# Patient Record
Sex: Male | Born: 1955 | Race: White | Hispanic: No | Marital: Married | Smoking: Never smoker
Health system: Southern US, Community
[De-identification: ages and names within clinical notes are randomized; demographics above are authoritative.]

## PROBLEM LIST (undated history)

## (undated) DIAGNOSIS — G8929 Other chronic pain: Secondary | ICD-10-CM

## (undated) DIAGNOSIS — M25552 Pain in left hip: Secondary | ICD-10-CM

## (undated) DIAGNOSIS — H539 Unspecified visual disturbance: Secondary | ICD-10-CM

## (undated) DIAGNOSIS — M419 Scoliosis, unspecified: Secondary | ICD-10-CM

## (undated) DIAGNOSIS — M545 Low back pain, unspecified: Secondary | ICD-10-CM

## (undated) DIAGNOSIS — R519 Headache, unspecified: Secondary | ICD-10-CM

## (undated) DIAGNOSIS — M1990 Unspecified osteoarthritis, unspecified site: Secondary | ICD-10-CM

## (undated) DIAGNOSIS — M4800 Spinal stenosis, site unspecified: Secondary | ICD-10-CM

## (undated) DIAGNOSIS — M25551 Pain in right hip: Secondary | ICD-10-CM

## (undated) HISTORY — PX: TONSILLECTOMY: SUR1361

## (undated) HISTORY — PX: ROTATOR CUFF REPAIR: SHX139

## (undated) HISTORY — DX: Scoliosis, unspecified: M41.9

## (undated) HISTORY — DX: Low back pain, unspecified: M54.50

## (undated) HISTORY — DX: Pain in right hip: M25.551

## (undated) HISTORY — DX: Pain in left hip: M25.552

## (undated) HISTORY — DX: Other chronic pain: G89.29

## (undated) HISTORY — PX: INGUINAL HERNIA REPAIR: SUR1180

## (undated) HISTORY — PX: VASECTOMY: SHX75

## (undated) HISTORY — DX: Unspecified visual disturbance: H53.9

## (undated) HISTORY — PX: ANTERIOR CRUCIATE LIGAMENT REPAIR: SHX115

## (undated) HISTORY — DX: Spinal stenosis, site unspecified: M48.00

## (undated) HISTORY — PX: CARPAL TUNNEL RELEASE: SHX101

## (undated) HISTORY — DX: Headache, unspecified: R51.9

---

## 2007-09-17 HISTORY — PX: SPINAL CORD STIMULATOR IMPLANT: SHX2422

## 2017-01-09 DIAGNOSIS — M542 Cervicalgia: Secondary | ICD-10-CM | POA: Insufficient documentation

## 2017-01-09 DIAGNOSIS — Z9689 Presence of other specified functional implants: Secondary | ICD-10-CM | POA: Insufficient documentation

## 2018-01-30 DIAGNOSIS — G894 Chronic pain syndrome: Secondary | ICD-10-CM | POA: Insufficient documentation

## 2018-01-30 DIAGNOSIS — M5030 Other cervical disc degeneration, unspecified cervical region: Secondary | ICD-10-CM | POA: Insufficient documentation

## 2018-01-30 DIAGNOSIS — M542 Cervicalgia: Secondary | ICD-10-CM | POA: Insufficient documentation

## 2018-01-30 DIAGNOSIS — M47812 Spondylosis without myelopathy or radiculopathy, cervical region: Secondary | ICD-10-CM | POA: Insufficient documentation

## 2020-05-17 DEATH — deceased

## 2020-07-06 DIAGNOSIS — M62830 Muscle spasm of back: Secondary | ICD-10-CM | POA: Diagnosis not present

## 2020-07-06 DIAGNOSIS — M546 Pain in thoracic spine: Secondary | ICD-10-CM | POA: Diagnosis not present

## 2020-07-06 DIAGNOSIS — M545 Low back pain, unspecified: Secondary | ICD-10-CM | POA: Diagnosis not present

## 2020-07-07 DIAGNOSIS — Z03818 Encounter for observation for suspected exposure to other biological agents ruled out: Secondary | ICD-10-CM | POA: Diagnosis not present

## 2020-07-07 DIAGNOSIS — Z20822 Contact with and (suspected) exposure to covid-19: Secondary | ICD-10-CM | POA: Diagnosis not present

## 2020-08-08 DIAGNOSIS — H1820 Unspecified corneal edema: Secondary | ICD-10-CM | POA: Diagnosis not present

## 2020-08-22 DIAGNOSIS — H1820 Unspecified corneal edema: Secondary | ICD-10-CM | POA: Diagnosis not present

## 2020-08-25 DIAGNOSIS — M19019 Primary osteoarthritis, unspecified shoulder: Secondary | ICD-10-CM | POA: Diagnosis not present

## 2020-08-25 DIAGNOSIS — M25551 Pain in right hip: Secondary | ICD-10-CM | POA: Diagnosis not present

## 2020-08-25 DIAGNOSIS — M5020 Other cervical disc displacement, unspecified cervical region: Secondary | ICD-10-CM | POA: Diagnosis not present

## 2020-08-25 DIAGNOSIS — M25552 Pain in left hip: Secondary | ICD-10-CM | POA: Diagnosis not present

## 2020-12-04 DIAGNOSIS — S40011A Contusion of right shoulder, initial encounter: Secondary | ICD-10-CM | POA: Diagnosis not present

## 2020-12-04 DIAGNOSIS — M19011 Primary osteoarthritis, right shoulder: Secondary | ICD-10-CM | POA: Diagnosis not present

## 2021-01-09 DIAGNOSIS — M7062 Trochanteric bursitis, left hip: Secondary | ICD-10-CM | POA: Diagnosis not present

## 2021-01-09 DIAGNOSIS — M19011 Primary osteoarthritis, right shoulder: Secondary | ICD-10-CM | POA: Diagnosis not present

## 2021-01-09 DIAGNOSIS — M7061 Trochanteric bursitis, right hip: Secondary | ICD-10-CM | POA: Diagnosis not present

## 2021-01-09 DIAGNOSIS — M19012 Primary osteoarthritis, left shoulder: Secondary | ICD-10-CM | POA: Diagnosis not present

## 2021-01-18 DIAGNOSIS — M19011 Primary osteoarthritis, right shoulder: Secondary | ICD-10-CM | POA: Diagnosis not present

## 2021-01-18 DIAGNOSIS — M19012 Primary osteoarthritis, left shoulder: Secondary | ICD-10-CM | POA: Diagnosis not present

## 2021-01-18 DIAGNOSIS — M160 Bilateral primary osteoarthritis of hip: Secondary | ICD-10-CM | POA: Diagnosis not present

## 2021-01-23 DIAGNOSIS — M19011 Primary osteoarthritis, right shoulder: Secondary | ICD-10-CM | POA: Diagnosis not present

## 2021-01-23 DIAGNOSIS — M7062 Trochanteric bursitis, left hip: Secondary | ICD-10-CM | POA: Diagnosis not present

## 2021-01-23 DIAGNOSIS — M19012 Primary osteoarthritis, left shoulder: Secondary | ICD-10-CM | POA: Diagnosis not present

## 2021-01-23 DIAGNOSIS — M7061 Trochanteric bursitis, right hip: Secondary | ICD-10-CM | POA: Diagnosis not present

## 2021-01-30 DIAGNOSIS — M545 Low back pain, unspecified: Secondary | ICD-10-CM | POA: Diagnosis not present

## 2021-01-30 DIAGNOSIS — G894 Chronic pain syndrome: Secondary | ICD-10-CM | POA: Diagnosis not present

## 2021-01-30 DIAGNOSIS — M542 Cervicalgia: Secondary | ICD-10-CM | POA: Diagnosis not present

## 2021-01-30 DIAGNOSIS — M1611 Unilateral primary osteoarthritis, right hip: Secondary | ICD-10-CM | POA: Diagnosis not present

## 2021-02-01 DIAGNOSIS — Z20822 Contact with and (suspected) exposure to covid-19: Secondary | ICD-10-CM | POA: Diagnosis not present

## 2021-02-02 DIAGNOSIS — Z03818 Encounter for observation for suspected exposure to other biological agents ruled out: Secondary | ICD-10-CM | POA: Diagnosis not present

## 2021-02-02 DIAGNOSIS — Z20822 Contact with and (suspected) exposure to covid-19: Secondary | ICD-10-CM | POA: Diagnosis not present

## 2021-02-16 DIAGNOSIS — M4307 Spondylolysis, lumbosacral region: Secondary | ICD-10-CM | POA: Diagnosis not present

## 2021-02-16 DIAGNOSIS — M5136 Other intervertebral disc degeneration, lumbar region: Secondary | ICD-10-CM | POA: Diagnosis not present

## 2021-02-16 DIAGNOSIS — M47812 Spondylosis without myelopathy or radiculopathy, cervical region: Secondary | ICD-10-CM | POA: Diagnosis not present

## 2021-02-16 DIAGNOSIS — M48061 Spinal stenosis, lumbar region without neurogenic claudication: Secondary | ICD-10-CM | POA: Diagnosis not present

## 2021-02-20 DIAGNOSIS — M19012 Primary osteoarthritis, left shoulder: Secondary | ICD-10-CM | POA: Diagnosis not present

## 2021-02-20 DIAGNOSIS — M7061 Trochanteric bursitis, right hip: Secondary | ICD-10-CM | POA: Diagnosis not present

## 2021-02-20 DIAGNOSIS — M19011 Primary osteoarthritis, right shoulder: Secondary | ICD-10-CM | POA: Diagnosis not present

## 2021-02-20 DIAGNOSIS — M7062 Trochanteric bursitis, left hip: Secondary | ICD-10-CM | POA: Diagnosis not present

## 2021-02-27 DIAGNOSIS — M5416 Radiculopathy, lumbar region: Secondary | ICD-10-CM | POA: Diagnosis not present

## 2021-02-27 DIAGNOSIS — M47816 Spondylosis without myelopathy or radiculopathy, lumbar region: Secondary | ICD-10-CM | POA: Diagnosis not present

## 2021-02-27 DIAGNOSIS — M19012 Primary osteoarthritis, left shoulder: Secondary | ICD-10-CM | POA: Diagnosis not present

## 2021-02-27 DIAGNOSIS — M47812 Spondylosis without myelopathy or radiculopathy, cervical region: Secondary | ICD-10-CM | POA: Diagnosis not present

## 2021-03-13 DIAGNOSIS — M545 Low back pain, unspecified: Secondary | ICD-10-CM | POA: Diagnosis not present

## 2021-03-13 DIAGNOSIS — M5442 Lumbago with sciatica, left side: Secondary | ICD-10-CM | POA: Diagnosis not present

## 2021-03-13 DIAGNOSIS — M5441 Lumbago with sciatica, right side: Secondary | ICD-10-CM | POA: Diagnosis not present

## 2021-03-14 DIAGNOSIS — J439 Emphysema, unspecified: Secondary | ICD-10-CM | POA: Diagnosis not present

## 2021-03-14 DIAGNOSIS — M19012 Primary osteoarthritis, left shoulder: Secondary | ICD-10-CM | POA: Diagnosis not present

## 2021-03-14 DIAGNOSIS — M75112 Incomplete rotator cuff tear or rupture of left shoulder, not specified as traumatic: Secondary | ICD-10-CM | POA: Diagnosis not present

## 2021-03-14 DIAGNOSIS — S46012A Strain of muscle(s) and tendon(s) of the rotator cuff of left shoulder, initial encounter: Secondary | ICD-10-CM | POA: Diagnosis not present

## 2021-03-16 DIAGNOSIS — M67912 Unspecified disorder of synovium and tendon, left shoulder: Secondary | ICD-10-CM | POA: Diagnosis not present

## 2021-03-29 DIAGNOSIS — M7062 Trochanteric bursitis, left hip: Secondary | ICD-10-CM | POA: Diagnosis not present

## 2021-03-29 DIAGNOSIS — M7061 Trochanteric bursitis, right hip: Secondary | ICD-10-CM | POA: Diagnosis not present

## 2021-03-29 DIAGNOSIS — M545 Low back pain, unspecified: Secondary | ICD-10-CM | POA: Diagnosis not present

## 2021-04-03 DIAGNOSIS — X58XXXA Exposure to other specified factors, initial encounter: Secondary | ICD-10-CM | POA: Diagnosis not present

## 2021-04-03 DIAGNOSIS — S46012A Strain of muscle(s) and tendon(s) of the rotator cuff of left shoulder, initial encounter: Secondary | ICD-10-CM | POA: Diagnosis not present

## 2021-04-03 DIAGNOSIS — M7542 Impingement syndrome of left shoulder: Secondary | ICD-10-CM | POA: Diagnosis not present

## 2021-04-03 DIAGNOSIS — Y999 Unspecified external cause status: Secondary | ICD-10-CM | POA: Diagnosis not present

## 2021-04-03 DIAGNOSIS — G8918 Other acute postprocedural pain: Secondary | ICD-10-CM | POA: Diagnosis not present

## 2021-04-03 DIAGNOSIS — M75102 Unspecified rotator cuff tear or rupture of left shoulder, not specified as traumatic: Secondary | ICD-10-CM | POA: Diagnosis not present

## 2021-04-03 DIAGNOSIS — M24112 Other articular cartilage disorders, left shoulder: Secondary | ICD-10-CM | POA: Diagnosis not present

## 2021-04-03 DIAGNOSIS — M19012 Primary osteoarthritis, left shoulder: Secondary | ICD-10-CM | POA: Diagnosis not present

## 2021-04-09 DIAGNOSIS — M5416 Radiculopathy, lumbar region: Secondary | ICD-10-CM | POA: Diagnosis not present

## 2021-04-16 DIAGNOSIS — M75102 Unspecified rotator cuff tear or rupture of left shoulder, not specified as traumatic: Secondary | ICD-10-CM | POA: Diagnosis not present

## 2021-04-16 DIAGNOSIS — M25612 Stiffness of left shoulder, not elsewhere classified: Secondary | ICD-10-CM | POA: Diagnosis not present

## 2021-04-24 DIAGNOSIS — M25612 Stiffness of left shoulder, not elsewhere classified: Secondary | ICD-10-CM | POA: Diagnosis not present

## 2021-04-24 DIAGNOSIS — M75102 Unspecified rotator cuff tear or rupture of left shoulder, not specified as traumatic: Secondary | ICD-10-CM | POA: Diagnosis not present

## 2021-05-08 DIAGNOSIS — M25612 Stiffness of left shoulder, not elsewhere classified: Secondary | ICD-10-CM | POA: Diagnosis not present

## 2021-05-08 DIAGNOSIS — M75102 Unspecified rotator cuff tear or rupture of left shoulder, not specified as traumatic: Secondary | ICD-10-CM | POA: Diagnosis not present

## 2021-05-15 DIAGNOSIS — M25612 Stiffness of left shoulder, not elsewhere classified: Secondary | ICD-10-CM | POA: Diagnosis not present

## 2021-05-15 DIAGNOSIS — M75102 Unspecified rotator cuff tear or rupture of left shoulder, not specified as traumatic: Secondary | ICD-10-CM | POA: Diagnosis not present

## 2021-05-17 DIAGNOSIS — M5416 Radiculopathy, lumbar region: Secondary | ICD-10-CM | POA: Diagnosis not present

## 2021-05-22 DIAGNOSIS — M5412 Radiculopathy, cervical region: Secondary | ICD-10-CM | POA: Diagnosis not present

## 2021-05-22 DIAGNOSIS — M25612 Stiffness of left shoulder, not elsewhere classified: Secondary | ICD-10-CM | POA: Diagnosis not present

## 2021-05-22 DIAGNOSIS — M75102 Unspecified rotator cuff tear or rupture of left shoulder, not specified as traumatic: Secondary | ICD-10-CM | POA: Diagnosis not present

## 2021-05-22 DIAGNOSIS — G894 Chronic pain syndrome: Secondary | ICD-10-CM | POA: Diagnosis not present

## 2021-05-22 DIAGNOSIS — M47812 Spondylosis without myelopathy or radiculopathy, cervical region: Secondary | ICD-10-CM | POA: Diagnosis not present

## 2021-05-28 DIAGNOSIS — M545 Low back pain, unspecified: Secondary | ICD-10-CM | POA: Diagnosis not present

## 2021-05-28 DIAGNOSIS — M25612 Stiffness of left shoulder, not elsewhere classified: Secondary | ICD-10-CM | POA: Diagnosis not present

## 2021-05-28 DIAGNOSIS — M5442 Lumbago with sciatica, left side: Secondary | ICD-10-CM | POA: Diagnosis not present

## 2021-05-28 DIAGNOSIS — M75102 Unspecified rotator cuff tear or rupture of left shoulder, not specified as traumatic: Secondary | ICD-10-CM | POA: Diagnosis not present

## 2021-05-28 DIAGNOSIS — M5441 Lumbago with sciatica, right side: Secondary | ICD-10-CM | POA: Diagnosis not present

## 2021-05-31 DIAGNOSIS — M75102 Unspecified rotator cuff tear or rupture of left shoulder, not specified as traumatic: Secondary | ICD-10-CM | POA: Diagnosis not present

## 2021-05-31 DIAGNOSIS — M25612 Stiffness of left shoulder, not elsewhere classified: Secondary | ICD-10-CM | POA: Diagnosis not present

## 2021-06-01 DIAGNOSIS — M5416 Radiculopathy, lumbar region: Secondary | ICD-10-CM | POA: Diagnosis not present

## 2021-06-04 DIAGNOSIS — M25612 Stiffness of left shoulder, not elsewhere classified: Secondary | ICD-10-CM | POA: Diagnosis not present

## 2021-06-04 DIAGNOSIS — M75102 Unspecified rotator cuff tear or rupture of left shoulder, not specified as traumatic: Secondary | ICD-10-CM | POA: Diagnosis not present

## 2021-06-06 DIAGNOSIS — M75102 Unspecified rotator cuff tear or rupture of left shoulder, not specified as traumatic: Secondary | ICD-10-CM | POA: Diagnosis not present

## 2021-06-06 DIAGNOSIS — M25612 Stiffness of left shoulder, not elsewhere classified: Secondary | ICD-10-CM | POA: Diagnosis not present

## 2021-06-11 DIAGNOSIS — M25612 Stiffness of left shoulder, not elsewhere classified: Secondary | ICD-10-CM | POA: Diagnosis not present

## 2021-06-11 DIAGNOSIS — M75102 Unspecified rotator cuff tear or rupture of left shoulder, not specified as traumatic: Secondary | ICD-10-CM | POA: Diagnosis not present

## 2021-06-14 DIAGNOSIS — M25612 Stiffness of left shoulder, not elsewhere classified: Secondary | ICD-10-CM | POA: Diagnosis not present

## 2021-06-14 DIAGNOSIS — M75102 Unspecified rotator cuff tear or rupture of left shoulder, not specified as traumatic: Secondary | ICD-10-CM | POA: Diagnosis not present

## 2021-06-18 DIAGNOSIS — M25612 Stiffness of left shoulder, not elsewhere classified: Secondary | ICD-10-CM | POA: Diagnosis not present

## 2021-06-18 DIAGNOSIS — M75102 Unspecified rotator cuff tear or rupture of left shoulder, not specified as traumatic: Secondary | ICD-10-CM | POA: Diagnosis not present

## 2021-06-21 DIAGNOSIS — M75102 Unspecified rotator cuff tear or rupture of left shoulder, not specified as traumatic: Secondary | ICD-10-CM | POA: Diagnosis not present

## 2021-06-21 DIAGNOSIS — M25612 Stiffness of left shoulder, not elsewhere classified: Secondary | ICD-10-CM | POA: Diagnosis not present

## 2021-06-26 DIAGNOSIS — M25612 Stiffness of left shoulder, not elsewhere classified: Secondary | ICD-10-CM | POA: Diagnosis not present

## 2021-06-26 DIAGNOSIS — M75102 Unspecified rotator cuff tear or rupture of left shoulder, not specified as traumatic: Secondary | ICD-10-CM | POA: Diagnosis not present

## 2021-06-28 DIAGNOSIS — M25612 Stiffness of left shoulder, not elsewhere classified: Secondary | ICD-10-CM | POA: Diagnosis not present

## 2021-06-28 DIAGNOSIS — M75102 Unspecified rotator cuff tear or rupture of left shoulder, not specified as traumatic: Secondary | ICD-10-CM | POA: Diagnosis not present

## 2021-07-04 DIAGNOSIS — M5416 Radiculopathy, lumbar region: Secondary | ICD-10-CM | POA: Diagnosis not present

## 2021-07-31 DIAGNOSIS — G894 Chronic pain syndrome: Secondary | ICD-10-CM | POA: Diagnosis not present

## 2021-09-11 ENCOUNTER — Other Ambulatory Visit: Payer: Self-pay | Admitting: Orthopedic Surgery

## 2021-09-11 DIAGNOSIS — M5412 Radiculopathy, cervical region: Secondary | ICD-10-CM

## 2021-09-20 ENCOUNTER — Ambulatory Visit
Admission: RE | Admit: 2021-09-20 | Discharge: 2021-09-20 | Disposition: A | Discharge status: Home / Self Care | Payer: Self-pay | Source: Ambulatory Visit | Attending: Orthopedic Surgery | Admitting: Orthopedic Surgery

## 2021-09-20 ENCOUNTER — Other Ambulatory Visit: Payer: Self-pay

## 2021-09-20 ENCOUNTER — Ambulatory Visit
Admission: RE | Admit: 2021-09-20 | Discharge: 2021-09-20 | Disposition: A | Discharge status: Home / Self Care | Payer: BC Managed Care – PPO | Source: Ambulatory Visit | Attending: Orthopedic Surgery | Admitting: Orthopedic Surgery

## 2021-09-20 ENCOUNTER — Other Ambulatory Visit: Payer: Self-pay | Admitting: Orthopedic Surgery

## 2021-09-20 DIAGNOSIS — M2578 Osteophyte, vertebrae: Secondary | ICD-10-CM | POA: Diagnosis not present

## 2021-09-20 DIAGNOSIS — M5442 Lumbago with sciatica, left side: Secondary | ICD-10-CM | POA: Diagnosis not present

## 2021-09-20 DIAGNOSIS — M5412 Radiculopathy, cervical region: Secondary | ICD-10-CM

## 2021-09-20 DIAGNOSIS — M25552 Pain in left hip: Secondary | ICD-10-CM | POA: Diagnosis not present

## 2021-09-20 DIAGNOSIS — R519 Headache, unspecified: Secondary | ICD-10-CM | POA: Diagnosis not present

## 2021-09-20 DIAGNOSIS — M25551 Pain in right hip: Secondary | ICD-10-CM | POA: Diagnosis not present

## 2021-09-20 DIAGNOSIS — R52 Pain, unspecified: Secondary | ICD-10-CM

## 2021-09-20 DIAGNOSIS — M530 Cervicocranial syndrome: Secondary | ICD-10-CM | POA: Diagnosis not present

## 2021-09-20 DIAGNOSIS — M5031 Other cervical disc degeneration,  high cervical region: Secondary | ICD-10-CM | POA: Diagnosis not present

## 2021-09-20 DIAGNOSIS — M5441 Lumbago with sciatica, right side: Secondary | ICD-10-CM | POA: Diagnosis not present

## 2021-09-20 MED ORDER — IOPAMIDOL (ISOVUE-M 300) INJECTION 61%
10.0000 mL | Freq: Once | INTRAMUSCULAR | Status: AC
  Administered 2021-09-20: 10 mL via INTRATHECAL

## 2021-09-20 MED ORDER — MEPERIDINE HCL 50 MG/ML IJ SOLN: 50.0000 mg | Freq: Once | INTRAMUSCULAR | Status: DC | PRN

## 2021-09-20 MED ORDER — DIAZEPAM 5 MG PO TABS: 10.0000 mg | ORAL_TABLET | Freq: Once | ORAL | Status: DC

## 2021-09-20 MED ORDER — ONDANSETRON HCL 4 MG/2ML IJ SOLN: 4.0000 mg | Freq: Once | INTRAMUSCULAR | Status: DC | PRN

## 2021-09-20 NOTE — Discharge Instructions (Signed)

## 2021-09-20 NOTE — Progress Notes (Signed)
The pt reports his spinal cord stimulator has been turned off for the myelogram procedure.

## 2021-09-24 DIAGNOSIS — M542 Cervicalgia: Secondary | ICD-10-CM | POA: Diagnosis not present

## 2021-09-25 DIAGNOSIS — M47816 Spondylosis without myelopathy or radiculopathy, lumbar region: Secondary | ICD-10-CM | POA: Diagnosis not present

## 2021-09-25 DIAGNOSIS — M1611 Unilateral primary osteoarthritis, right hip: Secondary | ICD-10-CM | POA: Diagnosis not present

## 2021-09-25 DIAGNOSIS — G894 Chronic pain syndrome: Secondary | ICD-10-CM | POA: Diagnosis not present

## 2021-10-19 DIAGNOSIS — M5412 Radiculopathy, cervical region: Secondary | ICD-10-CM | POA: Diagnosis not present

## 2021-10-19 DIAGNOSIS — M542 Cervicalgia: Secondary | ICD-10-CM | POA: Diagnosis not present

## 2021-10-24 DIAGNOSIS — G8929 Other chronic pain: Secondary | ICD-10-CM | POA: Diagnosis not present

## 2021-10-24 DIAGNOSIS — Z23 Encounter for immunization: Secondary | ICD-10-CM | POA: Diagnosis not present

## 2021-10-24 DIAGNOSIS — R7301 Impaired fasting glucose: Secondary | ICD-10-CM | POA: Diagnosis not present

## 2021-10-24 DIAGNOSIS — M5137 Other intervertebral disc degeneration, lumbosacral region: Secondary | ICD-10-CM | POA: Diagnosis not present

## 2021-10-24 DIAGNOSIS — Z0000 Encounter for general adult medical examination without abnormal findings: Secondary | ICD-10-CM | POA: Diagnosis not present

## 2021-10-26 DIAGNOSIS — R200 Anesthesia of skin: Secondary | ICD-10-CM | POA: Diagnosis not present

## 2021-10-30 DIAGNOSIS — M7062 Trochanteric bursitis, left hip: Secondary | ICD-10-CM | POA: Diagnosis not present

## 2021-10-30 DIAGNOSIS — M5441 Lumbago with sciatica, right side: Secondary | ICD-10-CM | POA: Diagnosis not present

## 2021-10-30 DIAGNOSIS — M5442 Lumbago with sciatica, left side: Secondary | ICD-10-CM | POA: Diagnosis not present

## 2021-10-31 DIAGNOSIS — R7301 Impaired fasting glucose: Secondary | ICD-10-CM | POA: Diagnosis not present

## 2021-10-31 DIAGNOSIS — M5412 Radiculopathy, cervical region: Secondary | ICD-10-CM | POA: Diagnosis not present

## 2021-10-31 DIAGNOSIS — Z0000 Encounter for general adult medical examination without abnormal findings: Secondary | ICD-10-CM | POA: Diagnosis not present

## 2021-11-07 DIAGNOSIS — Z0000 Encounter for general adult medical examination without abnormal findings: Secondary | ICD-10-CM | POA: Diagnosis not present

## 2021-11-07 DIAGNOSIS — R7303 Prediabetes: Secondary | ICD-10-CM | POA: Diagnosis not present

## 2021-11-07 DIAGNOSIS — R7989 Other specified abnormal findings of blood chemistry: Secondary | ICD-10-CM | POA: Diagnosis not present

## 2021-11-07 DIAGNOSIS — E7800 Pure hypercholesterolemia, unspecified: Secondary | ICD-10-CM | POA: Diagnosis not present

## 2021-11-09 DIAGNOSIS — M5412 Radiculopathy, cervical region: Secondary | ICD-10-CM | POA: Diagnosis not present

## 2021-11-09 DIAGNOSIS — M545 Low back pain, unspecified: Secondary | ICD-10-CM | POA: Diagnosis not present

## 2021-11-13 ENCOUNTER — Other Ambulatory Visit: Payer: Self-pay | Admitting: Orthopedic Surgery

## 2021-11-14 ENCOUNTER — Other Ambulatory Visit: Payer: Self-pay | Admitting: *Deleted

## 2021-11-14 ENCOUNTER — Encounter: Payer: Self-pay | Admitting: *Deleted

## 2021-11-16 ENCOUNTER — Encounter: Payer: Self-pay | Admitting: Neurology

## 2021-11-16 ENCOUNTER — Ambulatory Visit: Payer: BC Managed Care – PPO | Admitting: Neurology

## 2021-11-16 ENCOUNTER — Other Ambulatory Visit: Payer: Self-pay

## 2021-11-16 VITALS — BP 139/84 | HR 60 | Ht 70.5 in | Wt 182.0 lb

## 2021-11-16 DIAGNOSIS — R519 Headache, unspecified: Secondary | ICD-10-CM | POA: Diagnosis not present

## 2021-11-16 MED ORDER — SUMATRIPTAN SUCCINATE 25 MG PO TABS: 25.0000 mg | ORAL_TABLET | ORAL | 3 refills | Status: DC | PRN

## 2021-11-16 NOTE — Progress Notes (Signed)
? ?Chief Complaint  ?Patient presents with  ? New Patient (Initial Visit)  ?  Room 14 - alone. Reports some type of headache daily. At times it feels sharp, stabbing, like ice picks. Other times, feels like a "vice grip" on around his temples. Denies any vision changes. States new glasses last week resolved that issue. He does not take OTC medication. He uses hydrocodone-apap, three times daily, for his back pain.   ? ? ? ? ?ASSESSMENT AND PLAN ? ?Brett Hernandez is a 66 y.o. male   ?New onset worsening daily headaches, ? known history of cervical degenerative disc disease, history of migraine headache many years ago, ? Normal examination, ? He desires further evaluations, proceed with CT scan of the brain, ESR C-reactive protein to rule out temporal arteritis, most likely related to his cervicogenic musculoskeletal tension, ? Continue tizanidine, diclofenac as needed, may try Imitrex 25 mg as needed for prolonged severe headaches ? ?DIAGNOSTIC DATA (LABS, IMAGING, TESTING) ?- I reviewed patient records, labs, notes, testing and imaging myself where available. ? ? ?MEDICAL HISTORY: ? ?Brett Hernandez, is a 65 year old male seen in request by Dr. Phylliss Bob, for evaluation of frequent headaches, ?His primary care is nurse practitioner Deon Pilling, initial evaluation was on December 03, 2021 ? ?I reviewed and summarized the referring note. PMHX ?Chronic insomnia ?Neuropathic pain, ?Chronic low back pain, ?Smoker quit in 2021,  ? ?Patient reported a history of migraine headache many years ago, he describes his typical migraine lateralized severe pounding headache, retro-orbital area, light noise sensitivity, but he has not had migraine for many years ? ?He has a known history of cervical degenerative disc disease, over the years, suffered slow worsening neck pain, radiating pain to bilateral upper extremity, he has experienced severe stabbing horrible arm pain for many years, received cervical spinal stimulator [facility] in 2009, which did help his symptoms, now he has recurrent neck pain, radiating pain to bilateral shoulder and upper extremity, right worse than left ? ?I personally reviewed cervical myelogram in January 2023, neurostimulator in the dorsal midline, multilevel disc degenerative disease, most severe at C4-5, C5-6, bulging disc, facet osteoarthritis, right foraminal stenosis, potential compression of the right C5 nerve, and bilateral C6 nerve roots ? ?He has pending cervical decompression surgery by Dr. Lynann Bologna on December 06, 2021, ? ?He has constant bilateral hands paresthesia, denies gait abnormality, denies bowel and bladder incontinence ? ?He is already on pain management, polypharmacy treatment, including Norco 10/325 4 tablets a day, diclofenac 75 mg twice a day, tizanidine 4 mg 3 times a day, gabapentin 300 mg 3 times a day, trazodone for chronic insomnia ? ?Despite polypharmacy treatment, he complains of worsening headaches since January 2023, from intermittent few times each week now become daily, bilateral temporal region pressure headache, like tightening a vise, lasting for few hours, in addition, he has frequent ice pricking headache, he has 2 pulse during those transient severe pain ? ?He denies visual loss, denies chewing or swallowing difficulty ? ? ?PHYSICAL EXAM: ?  ?Vitals:  ? 11/16/21 0828  ?BP: 139/84  ?Pulse: 60  ?Weight: 182 lb (82.6 kg)  ?Height: 5' 10.5" (1.791 m)  ? ?Not recorded ?  ? ? ?Body mass index is 25.75 kg/m?. ? ?PHYSICAL EXAMNIATION: ? ?Gen: NAD, conversant, well nourised, well groomed                     ?Cardiovascular: Regular rate rhythm, no peripheral edema, warm, nontender. ?Eyes: Conjunctivae  clear without exudates or hemorrhage ?Neck: Supple, no carotid bruits. ?Pulmonary: Clear to auscultation bilaterally  ? ?NEUROLOGICAL EXAM: ? ?MENTAL STATUS: Anxious looking middle-age male ?Speech: ?   Speech is normal; fluent and spontaneous with normal comprehension.   ?Cognition: ?    Orientation to time, place and person ?    Normal recent and remote memory ?    Normal Attention span and concentration ?    Normal Language, naming, repeating,spontaneous speech ?    Fund of knowledge ?  ?CRANIAL NERVES: ?CN II: Visual fields are full to confrontation. Pupils are round equal and briskly reactive to light. ?CN III, IV, VI: extraocular movement are normal. No ptosis. ?CN V: Facial sensation is intact to light touch ?CN VII: Face is symmetric with normal eye closure  ?CN VIII: Hearing is normal to causal conversation. ?CN IX, X: Phonation is normal. ?CN XI: Head turning and shoulder shrug are intact ? ?MOTOR: ?There is no pronator drift of out-stretched arms. Muscle bulk and tone are normal. Muscle strength is normal. ? ?REFLEXES: ?Reflexes are 2+ and symmetric at the biceps, triceps, knees, and ankles. Plantar responses are flexor. ? ?SENSORY: ?Intact to light touch, pinprick and vibratory sensation are intact in fingers and toes. ? ?COORDINATION: ?There is no trunk or limb dysmetria noted. ? ?GAIT/STANCE: ?Posture is normal. Gait is steady with normal steps, base, arm swing, and turning. Heel and toe walking are normal. Tandem gait is normal.  ?Romberg is absent. ? ?REVIEW OF SYSTEMS:  ?Full 14 system review of systems performed and notable only for as above ?All other review of systems were negative. ? ? ?ALLERGIES: ?Allergies  ?Allergen Reactions  ? Grass Extracts [Gramineae Pollens]   ? ? ?HOME MEDICATIONS: ?Current Outpatient Medications  ?Medication Sig Dispense Refill  ? diclofenac (VOLTAREN) 75 MG EC tablet Take 75 mg by mouth 2 (two) times daily.    ? gabapentin (NEURONTIN) 300 MG capsule Take 300 mg by mouth 3 (three) times daily.    ? HYDROcodone-acetaminophen (NORCO) 10-325 MG tablet Take 1 tablet by mouth every 6 (six) hours as needed.    ? Multiple Vitamin (MULTIVITAMIN) tablet Take 1 tablet by mouth daily.    ? Omega-3 Fatty Acids (FISH OIL PO) Take 1 tablet by mouth  daily.    ? tiZANidine (ZANAFLEX) 4 MG tablet Take 4 mg by mouth every 8 (eight) hours as needed.    ? traZODone (DESYREL) 50 MG tablet Take 50-100 mg by mouth at bedtime.    ? ?No current facility-administered medications for this visit.  ? ? ?PAST MEDICAL HISTORY: ?Past Medical History:  ?Diagnosis Date  ? Chronic pain   ? cervical, lumbar  ? Headache   ? ? ?PAST SURGICAL HISTORY: ?Past Surgical History:  ?Procedure Laterality Date  ? ANTERIOR CRUCIATE LIGAMENT REPAIR    ? CARPAL TUNNEL RELEASE Bilateral   ? INGUINAL HERNIA REPAIR Bilateral   ? ROTATOR CUFF REPAIR    ? SPINAL CORD STIMULATOR IMPLANT  2009  ? in neck  ? ? ?FAMILY HISTORY: ?History reviewed. No pertinent family history. ? ?SOCIAL HISTORY: ?Social History  ? ?Socioeconomic History  ? Marital status: Married  ?  Spouse name: Chong Sicilian  ? Number of children: 2  ? Years of education: 14 years  ? Highest education level: Not on file  ?Occupational History  ?  Comment: [employer]  ? Occupation: produce specialist at [employer]  ?Tobacco Use  ? Smoking status: Never  ? Smokeless tobacco: Never  ?Substance  and Sexual Activity  ? Alcohol use: Not Currently  ? Drug use: Never  ? Sexual activity: Not on file  ?Other Topics Concern  ? Not on file  ?Social History Narrative  ? Lives with wife.  ? Right-handed.  ? 3 cups caffeine per day.  ? ?Social Determinants of Health  ? ?Financial Resource Strain: Not on file  ?Food Insecurity: Not on file  ?Transportation Needs: Not on file  ?Physical Activity: Not on file  ?Stress: Not on file  ?Social Connections: Not on file  ?Intimate Partner Violence: Not on file  ? ? ? ? ?Marcial Pacas, M.D. Ph.D. ? ?[facility] ?[address], [address] ?[place], [place] [address] ?Ph: [phone] ?Fax: [phone] ? ?CC:  Phylliss Bob, MD ?[address] ?[address] ?[place],  [place] [address]  Deon Pilling, NP   ?

## 2021-11-17 LAB — SEDIMENTATION RATE: Sed Rate: 23 mm/hr (ref 0–30)

## 2021-11-17 LAB — C-REACTIVE PROTEIN: CRP: 33 mg/L — ABNORMAL HIGH (ref 0–10)

## 2021-11-20 ENCOUNTER — Telehealth: Payer: Self-pay | Admitting: Neurology

## 2021-11-20 DIAGNOSIS — M545 Low back pain, unspecified: Secondary | ICD-10-CM | POA: Diagnosis not present

## 2021-11-20 DIAGNOSIS — M1611 Unilateral primary osteoarthritis, right hip: Secondary | ICD-10-CM | POA: Diagnosis not present

## 2021-11-20 DIAGNOSIS — G894 Chronic pain syndrome: Secondary | ICD-10-CM | POA: Diagnosis not present

## 2021-11-20 DIAGNOSIS — M542 Cervicalgia: Secondary | ICD-10-CM | POA: Diagnosis not present

## 2021-11-20 NOTE — Telephone Encounter (Signed)
Please call patient, laboratory evaluation showed normal ESR, elevated C-reactive protein 33 ? ?Above findings has unknown clinical significance, check to see if his headache has improved, whether the Imitrex trial was helping him ? ? ?

## 2021-11-20 NOTE — Telephone Encounter (Signed)
Spoke with patient and informed of results. Patient stated headaches have not improved. He has not tried Imitrex yet, due to headaches being short lived. Informed pt to call back with any questions or concerns. ?

## 2021-11-22 NOTE — Progress Notes (Signed)
Surgical Instructions ? ? ? Your procedure is scheduled on Thursday, March 23rd, 2023. ? ? Report to [facility] Main Entrance "A" at 10:20 A.M., then check in with the Admitting office. ? Call this number if you have problems the morning of surgery: ? [phone] ? ? If you have any questions prior to your surgery date call [phone]: Open Monday-Friday 8am-4pm ? ? ? Remember: ? Do not eat after midnight the night before your surgery ? ?You may drink clear liquids until 10:20 the morning of your surgery.   ?Clear liquids allowed are: Water, Non-Citrus Juices (without pulp), Carbonated Beverages, Clear Tea, Black Coffee ONLY (NO MILK, CREAM OR POWDERED CREAMER of any kind), and Gatorade ? ?Patient Instructions ? ?The night before surgery:  ?No food after midnight. ONLY clear liquids after midnight ? ?The day of surgery (if you do NOT have diabetes):  ?Drink ONE (1) Pre-Surgery Clear Ensure by 10:20 the morning of surgery. Drink in one sitting. Do not sip.  ?This drink was given to you during your hospital  ?pre-op appointment visit. ? ?Nothing else to drink after completing the  ?Pre-Surgery Clear Ensure. ? ?       If you have questions, please contact your surgeon?s office.  ?  ? Take these medicines the morning of surgery with A SIP OF WATER:  ? ?gabapentin (NEURONTIN)  ? ?If needed: ? ?HYDROcodone-acetaminophen (NORCO) ?oxymetazoline (AFRIN ?tiZANidine (ZANAFLEX) ?SUMAtriptan (IMITREX)  ? ?As of today, STOP taking any Aspirin (unless otherwise instructed by your surgeon) Aleve, Naproxen, Ibuprofen, Motrin, Advil, Goody's, BC's, all herbal medications, fish oil, and all vitamins. ? ? ? The day of surgery: ?         ?Do not wear jewelry  ?Do not wear lotions, powders, colognes, or deodorant. ?Men may shave face and neck. ?Do not bring valuables to the hospital. ? ? ?[facility] is not responsible for any belongings or valuables. .  ? ?Do NOT Smoke (Tobacco/Vaping)  24 hours prior to your procedure ? ?If you use  a CPAP at night, you may bring your mask for your overnight stay. ?  ?Contacts, glasses, hearing aids, dentures or partials may not be worn into surgery, please bring cases for these belongings ?  ?For patients admitted to the hospital, discharge time will be determined by your treatment team. ?  ?Patients discharged the day of surgery will not be allowed to drive home, and someone needs to stay with them for 24 hours. ? ?NO VISITORS WILL BE ALLOWED IN PRE-OP WHERE PATIENTS ARE PREPPED FOR SURGERY.  ONLY 1 SUPPORT PERSON MAY BE PRESENT IN THE WAITING ROOM WHILE YOU ARE IN SURGERY.  IF YOU ARE TO BE ADMITTED, ONCE YOU ARE IN YOUR ROOM YOU WILL BE ALLOWED TWO (2) VISITORS. 1 (ONE) VISITOR MAY STAY OVERNIGHT BUT MUST ARRIVE TO THE ROOM BY 8pm.  Minor children may have two parents present. Special consideration for safety and communication needs will be reviewed on a case by case basis. ? ?Special instructions:   ? ?Oral Hygiene is also important to reduce your risk of infection.  Remember - BRUSH YOUR TEETH THE MORNING OF SURGERY WITH YOUR REGULAR TOOTHPASTE ? ? ?[facility]- Preparing For Surgery ? ?Before surgery, you can play an important role. Because skin is not sterile, your skin needs to be as free of germs as possible. You can reduce the number of germs on your skin by washing with CHG (chlorahexidine gluconate) Soap before surgery.  CHG is an  antiseptic cleaner which kills germs and bonds with the skin to continue killing germs even after washing.   ? ? ?Please do not use if you have an allergy to CHG or antibacterial soaps. If your skin becomes reddened/irritated stop using the CHG.  ?Do not shave (including legs and underarms) for at least 48 hours prior to first CHG shower. It is OK to shave your face. ? ?Please follow these instructions carefully. ?  ? ? Shower the NIGHT BEFORE SURGERY and the MORNING OF SURGERY with CHG Soap.  ? If you chose to wash your hair, wash your hair first as usual with your  normal shampoo. After you shampoo, rinse your hair and body thoroughly to remove the shampoo.  Then [employer] and genitals (private parts) with your normal soap and rinse thoroughly to remove soap. ? ?After that Use CHG Soap as you would any other liquid soap. You can apply CHG directly to the skin and wash gently with a scrungie or a clean washcloth.  ? ?Apply the CHG Soap to your body ONLY FROM THE NECK DOWN.  Do not use on open wounds or open sores. Avoid contact with your eyes, ears, mouth and genitals (private parts). Wash Face and genitals (private parts)  with your normal soap.  ? ?Wash thoroughly, paying special attention to the area where your surgery will be performed. ? ?Thoroughly rinse your body with warm water from the neck down. ? ?DO NOT shower/wash with your normal soap after using and rinsing off the CHG Soap. ? ?Pat yourself dry with a CLEAN TOWEL. ? ?Wear CLEAN PAJAMAS to bed the night before surgery ? ?Place CLEAN SHEETS on your bed the night before your surgery ? ?DO NOT SLEEP WITH PETS. ? ? ?Day of Surgery: ? ?Take a shower with CHG soap. ?Wear Clean/Comfortable clothing the morning of surgery ?Do not apply any deodorants/lotions.   ?Remember to brush your teeth WITH YOUR REGULAR TOOTHPASTE. ? ?  ?Please read over the following fact sheets that you were given.   ?

## 2021-11-23 ENCOUNTER — Encounter (HOSPITAL_COMMUNITY): Payer: Self-pay

## 2021-11-23 ENCOUNTER — Encounter (HOSPITAL_COMMUNITY)
Admission: RE | Admit: 2021-11-23 | Discharge: 2021-11-23 | Disposition: A | Discharge status: Home / Self Care | Payer: BC Managed Care – PPO | Source: Ambulatory Visit | Attending: Orthopedic Surgery | Admitting: Orthopedic Surgery

## 2021-11-23 ENCOUNTER — Other Ambulatory Visit: Payer: Self-pay

## 2021-11-23 VITALS — BP 146/91 | HR 66 | Temp 98.2°F | Resp 17 | Ht 70.0 in | Wt 178.4 lb

## 2021-11-23 DIAGNOSIS — Z01812 Encounter for preprocedural laboratory examination: Secondary | ICD-10-CM | POA: Insufficient documentation

## 2021-11-23 DIAGNOSIS — Z01818 Encounter for other preprocedural examination: Secondary | ICD-10-CM

## 2021-11-23 HISTORY — DX: Unspecified osteoarthritis, unspecified site: M19.90

## 2021-11-23 LAB — CBC
HCT: 41.9 % (ref 39.0–52.0)
Hemoglobin: 13.3 g/dL (ref 13.0–17.0)
MCH: 28.2 pg (ref 26.0–34.0)
MCHC: 31.7 g/dL (ref 30.0–36.0)
MCV: 89 fL (ref 80.0–100.0)
Platelets: 359 10*3/uL (ref 150–400)
RBC: 4.71 MIL/uL (ref 4.22–5.81)
RDW: 13.4 % (ref 11.5–15.5)
WBC: 7 10*3/uL (ref 4.0–10.5)
nRBC: 0 % (ref 0.0–0.2)

## 2021-11-23 LAB — SURGICAL PCR SCREEN
MRSA, PCR: NEGATIVE
Staphylococcus aureus: NEGATIVE

## 2021-11-23 NOTE — Progress Notes (Signed)
PCP - Linus Galas, NP ?Cardiologist - denies ? ?PPM/ICD - denies ? ? ?Chest x-ray - pt states 2 years ago he had one, records requested ?EKG - 15 years ago per pt, normal ?Stress Test - denies ?ECHO - denies ?Cardiac Cath - denies ? ?Sleep Study - denies ? ? ?DM- denied ? ?ASA/Blood Thinner Instructions: n/a ? ? ?ERAS Protcol - yes ?PRE-SURGERY Ensure given at PAT ? ?COVID TEST- n/a ambulatory surgery ? ? ?Anesthesia review: no ? ?Patient denies shortness of breath, fever, cough and chest pain at PAT appointment ? ? ?All instructions explained to the patient, with a verbal understanding of the material. Patient agrees to go over the instructions while at home for a better understanding. Patient also instructed to wear a mask in public for 3 days prior to surgery. The opportunity to ask questions was provided. ?  ?

## 2021-11-27 ENCOUNTER — Telehealth: Payer: Self-pay | Admitting: Neurology

## 2021-11-27 NOTE — Telephone Encounter (Signed)
[employer] Josem Kaufmann: [id number] (exp. 11/27/21 to 01/25/22) order sent to GI, they will reach out to the patient to schedule.  ?

## 2021-12-03 DIAGNOSIS — M5412 Radiculopathy, cervical region: Secondary | ICD-10-CM | POA: Diagnosis not present

## 2021-12-06 ENCOUNTER — Other Ambulatory Visit: Payer: Self-pay

## 2021-12-06 ENCOUNTER — Encounter (HOSPITAL_COMMUNITY): Payer: Self-pay | Admitting: Orthopedic Surgery

## 2021-12-06 ENCOUNTER — Ambulatory Visit (HOSPITAL_COMMUNITY): Payer: BC Managed Care – PPO | Admitting: Certified Registered"

## 2021-12-06 ENCOUNTER — Ambulatory Visit (HOSPITAL_COMMUNITY)
Admission: RE | Admit: 2021-12-06 | Discharge: 2021-12-06 | Disposition: A | Discharge status: Home / Self Care | Payer: BC Managed Care – PPO | Source: Ambulatory Visit | Attending: Orthopedic Surgery | Admitting: Orthopedic Surgery

## 2021-12-06 ENCOUNTER — Ambulatory Visit (HOSPITAL_COMMUNITY): Payer: BC Managed Care – PPO

## 2021-12-06 ENCOUNTER — Encounter (HOSPITAL_COMMUNITY)
Admission: RE | Disposition: A | Discharge status: Home / Self Care | Payer: Self-pay | Source: Ambulatory Visit | Attending: Orthopedic Surgery

## 2021-12-06 DIAGNOSIS — M5000 Cervical disc disorder with myelopathy, unspecified cervical region: Secondary | ICD-10-CM

## 2021-12-06 DIAGNOSIS — M4322 Fusion of spine, cervical region: Secondary | ICD-10-CM | POA: Diagnosis not present

## 2021-12-06 DIAGNOSIS — M4802 Spinal stenosis, cervical region: Secondary | ICD-10-CM | POA: Insufficient documentation

## 2021-12-06 DIAGNOSIS — M5412 Radiculopathy, cervical region: Secondary | ICD-10-CM | POA: Diagnosis not present

## 2021-12-06 DIAGNOSIS — M5416 Radiculopathy, lumbar region: Secondary | ICD-10-CM | POA: Diagnosis not present

## 2021-12-06 DIAGNOSIS — Z981 Arthrodesis status: Secondary | ICD-10-CM | POA: Diagnosis not present

## 2021-12-06 HISTORY — PX: ANTERIOR CERVICAL DECOMP/DISCECTOMY FUSION: SHX1161

## 2021-12-06 SURGERY — ANTERIOR CERVICAL DECOMPRESSION/DISCECTOMY FUSION 1 LEVEL
Anesthesia: General | Site: Spine Cervical

## 2021-12-06 MED ORDER — LIDOCAINE 2% (20 MG/ML) 5 ML SYRINGE
INTRAMUSCULAR | Status: DC | PRN
  Administered 2021-12-06: 100 mg via INTRAVENOUS

## 2021-12-06 MED ORDER — HYDROMORPHONE HCL 1 MG/ML IJ SOLN
INTRAMUSCULAR | Status: AC
  Filled 2021-12-06: qty 0.5

## 2021-12-06 MED ORDER — BUPIVACAINE-EPINEPHRINE 0.25% -1:200000 IJ SOLN
INTRAMUSCULAR | Status: DC | PRN
  Administered 2021-12-06: 2 mL

## 2021-12-06 MED ORDER — FENTANYL CITRATE (PF) 250 MCG/5ML IJ SOLN
INTRAMUSCULAR | Status: DC | PRN
  Administered 2021-12-06 (×5): 50 ug via INTRAVENOUS

## 2021-12-06 MED ORDER — CEFAZOLIN SODIUM-DEXTROSE 2-4 GM/100ML-% IV SOLN
2.0000 g | INTRAVENOUS | Status: AC
  Administered 2021-12-06: 2 g via INTRAVENOUS
  Filled 2021-12-06: qty 100

## 2021-12-06 MED ORDER — ROCURONIUM BROMIDE 10 MG/ML (PF) SYRINGE
PREFILLED_SYRINGE | INTRAVENOUS | Status: AC
  Filled 2021-12-06: qty 10

## 2021-12-06 MED ORDER — KETAMINE HCL 10 MG/ML IJ SOLN
INTRAMUSCULAR | Status: DC | PRN
  Administered 2021-12-06 (×2): 25 mg via INTRAVENOUS

## 2021-12-06 MED ORDER — PHENYLEPHRINE HCL-NACL 20-0.9 MG/250ML-% IV SOLN
INTRAVENOUS | Status: DC | PRN
  Administered 2021-12-06: 50 ug/min via INTRAVENOUS

## 2021-12-06 MED ORDER — ONDANSETRON HCL 4 MG/2ML IJ SOLN: 4.0000 mg | Freq: Once | INTRAMUSCULAR | Status: DC | PRN

## 2021-12-06 MED ORDER — ONDANSETRON HCL 4 MG/2ML IJ SOLN
INTRAMUSCULAR | Status: AC
  Filled 2021-12-06: qty 2

## 2021-12-06 MED ORDER — ORAL CARE MOUTH RINSE: 15.0000 mL | Freq: Once | OROMUCOSAL | Status: AC

## 2021-12-06 MED ORDER — DEXAMETHASONE SODIUM PHOSPHATE 10 MG/ML IJ SOLN
INTRAMUSCULAR | Status: DC | PRN
Start: 2021-12-06 — End: 2021-12-06
  Administered 2021-12-06: 10 mg via INTRAVENOUS

## 2021-12-06 MED ORDER — ONDANSETRON HCL 4 MG/2ML IJ SOLN
INTRAMUSCULAR | Status: DC | PRN
  Administered 2021-12-06: 4 mg via INTRAVENOUS

## 2021-12-06 MED ORDER — MIDAZOLAM HCL 2 MG/2ML IJ SOLN
INTRAMUSCULAR | Status: AC
  Filled 2021-12-06: qty 2

## 2021-12-06 MED ORDER — MIDAZOLAM HCL 2 MG/2ML IJ SOLN
INTRAMUSCULAR | Status: DC | PRN
  Administered 2021-12-06: 2 mg via INTRAVENOUS

## 2021-12-06 MED ORDER — ROCURONIUM BROMIDE 10 MG/ML (PF) SYRINGE
PREFILLED_SYRINGE | INTRAVENOUS | Status: DC | PRN
  Administered 2021-12-06: 70 mg via INTRAVENOUS
  Administered 2021-12-06: 30 mg via INTRAVENOUS

## 2021-12-06 MED ORDER — HYDROMORPHONE HCL 1 MG/ML IJ SOLN
INTRAMUSCULAR | Status: DC | PRN
  Administered 2021-12-06: .5 mg via INTRAVENOUS

## 2021-12-06 MED ORDER — FENTANYL CITRATE (PF) 250 MCG/5ML IJ SOLN
INTRAMUSCULAR | Status: AC
  Filled 2021-12-06: qty 5

## 2021-12-06 MED ORDER — ACETAMINOPHEN 10 MG/ML IV SOLN
1000.0000 mg | Freq: Once | INTRAVENOUS | Status: DC | PRN
  Administered 2021-12-06: 1000 mg via INTRAVENOUS

## 2021-12-06 MED ORDER — KETAMINE HCL 50 MG/5ML IJ SOSY
PREFILLED_SYRINGE | INTRAMUSCULAR | Status: AC
  Filled 2021-12-06: qty 5

## 2021-12-06 MED ORDER — 0.9 % SODIUM CHLORIDE (POUR BTL) OPTIME
TOPICAL | Status: DC | PRN
  Administered 2021-12-06: 1000 mL

## 2021-12-06 MED ORDER — ACETAMINOPHEN 10 MG/ML IV SOLN
INTRAVENOUS | Status: AC
  Filled 2021-12-06: qty 100

## 2021-12-06 MED ORDER — PROPOFOL 10 MG/ML IV BOLUS
INTRAVENOUS | Status: AC
  Filled 2021-12-06: qty 20

## 2021-12-06 MED ORDER — HYDROMORPHONE HCL 1 MG/ML IJ SOLN
0.2500 mg | INTRAMUSCULAR | Status: DC | PRN
  Administered 2021-12-06 (×2): 0.5 mg via INTRAVENOUS

## 2021-12-06 MED ORDER — PROPOFOL 10 MG/ML IV BOLUS
INTRAVENOUS | Status: DC | PRN
  Administered 2021-12-06: 150 mg via INTRAVENOUS

## 2021-12-06 MED ORDER — CHLORHEXIDINE GLUCONATE 0.12 % MT SOLN
15.0000 mL | Freq: Once | OROMUCOSAL | Status: AC
  Administered 2021-12-06: 15 mL via OROMUCOSAL
  Filled 2021-12-06: qty 15

## 2021-12-06 MED ORDER — BUPIVACAINE-EPINEPHRINE (PF) 0.25% -1:200000 IJ SOLN
INTRAMUSCULAR | Status: AC
  Filled 2021-12-06: qty 30

## 2021-12-06 MED ORDER — THROMBIN 20000 UNITS EX SOLR: CUTANEOUS | Status: DC | PRN

## 2021-12-06 MED ORDER — HYDROMORPHONE HCL 1 MG/ML IJ SOLN
INTRAMUSCULAR | Status: AC
  Filled 2021-12-06: qty 1

## 2021-12-06 MED ORDER — SUGAMMADEX SODIUM 200 MG/2ML IV SOLN
INTRAVENOUS | Status: DC | PRN
  Administered 2021-12-06: 200 mg via INTRAVENOUS

## 2021-12-06 MED ORDER — LIDOCAINE 2% (20 MG/ML) 5 ML SYRINGE
INTRAMUSCULAR | Status: AC
  Filled 2021-12-06: qty 5

## 2021-12-06 MED ORDER — FENTANYL CITRATE (PF) 100 MCG/2ML IJ SOLN
INTRAMUSCULAR | Status: AC
  Administered 2021-12-06: 50 ug via INTRAVENOUS
  Filled 2021-12-06: qty 2

## 2021-12-06 MED ORDER — THROMBIN 20000 UNITS EX SOLR
CUTANEOUS | Status: AC
  Filled 2021-12-06: qty 20000

## 2021-12-06 MED ORDER — DEXAMETHASONE SODIUM PHOSPHATE 10 MG/ML IJ SOLN
INTRAMUSCULAR | Status: AC
  Filled 2021-12-06: qty 1

## 2021-12-06 MED ORDER — FENTANYL CITRATE (PF) 100 MCG/2ML IJ SOLN: 50.0000 ug | Freq: Once | INTRAMUSCULAR | Status: AC

## 2021-12-06 MED ORDER — FENTANYL CITRATE PF 50 MCG/ML IJ SOSY
50.0000 ug | PREFILLED_SYRINGE | Freq: Once | INTRAMUSCULAR | Status: AC
  Administered 2021-12-06: 50 ug via INTRAVENOUS

## 2021-12-06 MED ORDER — LACTATED RINGERS IV SOLN: INTRAVENOUS | Status: DC

## 2021-12-06 MED ORDER — OXYCODONE-ACETAMINOPHEN 5-325 MG PO TABS
1.0000 | ORAL_TABLET | ORAL | 0 refills | Status: AC | PRN
Start: 2021-12-06 — End: 2021-12-13

## 2021-12-06 SURGICAL SUPPLY — 78 items
BAG COUNTER SPONGE SURGICOUNT (BAG) ×2 IMPLANT
BENZOIN TINCTURE PRP APPL 2/3 (GAUZE/BANDAGES/DRESSINGS) ×2 IMPLANT
BIT DRILL NEURO 2X3.1 SFT TUCH (MISCELLANEOUS) ×1 IMPLANT
BIT DRILL SRG 14X2.2XFLT CHK (BIT) IMPLANT
BIT DRL SRG 14X2.2XFLT CHK (BIT) ×1
BLADE CLIPPER SURG (BLADE) ×2 IMPLANT
BLADE SURG 15 STRL LF DISP TIS (BLADE) ×1 IMPLANT
BLADE SURG 15 STRL SS (BLADE) ×1
BONE VIVIGEN FORMABLE 1.3CC (Bone Implant) ×2 IMPLANT
CARTRIDGE OIL MAESTRO DRILL (MISCELLANEOUS) ×1 IMPLANT
CNTNR URN SCR LID CUP LEK RST (MISCELLANEOUS) IMPLANT
CONT SPEC 4OZ STRL OR WHT (MISCELLANEOUS) ×1
CORD BIPOLAR FORCEPS 12FT (ELECTRODE) ×2 IMPLANT
COVER SURGICAL LIGHT HANDLE (MISCELLANEOUS) ×2 IMPLANT
DIFFUSER DRILL AIR PNEUMATIC (MISCELLANEOUS) ×2 IMPLANT
DRAIN JACKSON RD 7FR 3/32 (WOUND CARE) IMPLANT
DRAPE C-ARM 42X72 X-RAY (DRAPES) ×2 IMPLANT
DRAPE POUCH INSTRU U-SHP 10X18 (DRAPES) ×2 IMPLANT
DRAPE SURG 17X23 STRL (DRAPES) ×6 IMPLANT
DRAPE UTILITY W/TAPE 26X15 (DRAPES) ×2 IMPLANT
DRILL BIT SKYLINE 14MM (BIT) ×1
DRILL NEURO 2X3.1 SOFT TOUCH (MISCELLANEOUS) ×2
DURAPREP 26ML APPLICATOR (WOUND CARE) ×2 IMPLANT
ELECT COATED BLADE 2.86 ST (ELECTRODE) ×2 IMPLANT
ELECT REM PT RETURN 9FT ADLT (ELECTROSURGICAL) ×2
ELECTRODE REM PT RTRN 9FT ADLT (ELECTROSURGICAL) ×1 IMPLANT
EVACUATOR SILICONE 100CC (DRAIN) IMPLANT
GAUZE 4X4 16PLY ~~LOC~~+RFID DBL (SPONGE) ×2 IMPLANT
GAUZE SPONGE 4X4 12PLY STRL (GAUZE/BANDAGES/DRESSINGS) ×2 IMPLANT
GLOVE SRG 8 PF TXTR STRL LF DI (GLOVE) ×1 IMPLANT
GLOVE SURG ENC MOIS LTX SZ6.5 (GLOVE) ×2 IMPLANT
GLOVE SURG ENC MOIS LTX SZ8 (GLOVE) ×2 IMPLANT
GLOVE SURG UNDER POLY LF SZ7 (GLOVE) ×4 IMPLANT
GLOVE SURG UNDER POLY LF SZ8 (GLOVE) ×1
GOWN STRL REUS W/ TWL LRG LVL3 (GOWN DISPOSABLE) ×1 IMPLANT
GOWN STRL REUS W/ TWL XL LVL3 (GOWN DISPOSABLE) ×1 IMPLANT
GOWN STRL REUS W/TWL LRG LVL3 (GOWN DISPOSABLE) ×1
GOWN STRL REUS W/TWL XL LVL3 (GOWN DISPOSABLE) ×1
GRAFT BNE MATRIX VG FRMBL SM 1 (Bone Implant) IMPLANT
INTERLOCK LRDTC CRVCL VBR 6MM (Bone Implant) IMPLANT
IV CATH 14GX2 1/4 (CATHETERS) ×2 IMPLANT
KIT BASIN OR (CUSTOM PROCEDURE TRAY) ×2 IMPLANT
KIT TURNOVER KIT B (KITS) ×2 IMPLANT
LORDOTIC CERVICAL VBR 6MM SM (Bone Implant) ×2 IMPLANT
MANIFOLD NEPTUNE II (INSTRUMENTS) ×2 IMPLANT
NDL PRECISIONGLIDE 27X1.5 (NEEDLE) ×1 IMPLANT
NDL SPNL 20GX3.5 QUINCKE YW (NEEDLE) ×1 IMPLANT
NEEDLE PRECISIONGLIDE 27X1.5 (NEEDLE) ×2 IMPLANT
NEEDLE SPNL 20GX3.5 QUINCKE YW (NEEDLE) ×2 IMPLANT
NS IRRIG 1000ML POUR BTL (IV SOLUTION) ×2 IMPLANT
OIL CARTRIDGE MAESTRO DRILL (MISCELLANEOUS) ×2
PACK ORTHO CERVICAL (CUSTOM PROCEDURE TRAY) ×2 IMPLANT
PAD ARMBOARD 7.5X6 YLW CONV (MISCELLANEOUS) ×4 IMPLANT
PATTIES SURGICAL .5 X.5 (GAUZE/BANDAGES/DRESSINGS) IMPLANT
PATTIES SURGICAL .5 X1 (DISPOSABLE) IMPLANT
PENCIL BUTTON HOLSTER BLD 10FT (ELECTRODE) ×1 IMPLANT
PIN DISTRACTION 14 (PIN) ×2 IMPLANT
PLATE SKYLINE 12MM (Plate) ×1 IMPLANT
POSITIONER HEAD DONUT 9IN (MISCELLANEOUS) ×2 IMPLANT
SCREW SKYLINE VAR OS 14MM (Screw) ×4 IMPLANT
SPONGE INTESTINAL PEANUT (DISPOSABLE) ×2 IMPLANT
SPONGE SURGIFOAM ABS GEL 100 (HEMOSTASIS) IMPLANT
STRIP CLOSURE SKIN 1/2X4 (GAUZE/BANDAGES/DRESSINGS) ×2 IMPLANT
SURGIFLO W/THROMBIN 8M KIT (HEMOSTASIS) IMPLANT
SUT MNCRL AB 4-0 PS2 18 (SUTURE) ×2 IMPLANT
SUT SILK 4 0 (SUTURE)
SUT SILK 4-0 18XBRD TIE 12 (SUTURE) IMPLANT
SUT VIC AB 2-0 CT2 18 VCP726D (SUTURE) ×2 IMPLANT
SYR BULB IRRIG 60ML STRL (SYRINGE) ×2 IMPLANT
SYR CONTROL 10ML LL (SYRINGE) ×4 IMPLANT
TAPE CLOTH 4X10 WHT NS (GAUZE/BANDAGES/DRESSINGS) ×2 IMPLANT
TAPE CLOTH SURG 4X10 WHT LF (GAUZE/BANDAGES/DRESSINGS) ×1 IMPLANT
TAPE UMBILICAL 1/8X30 (MISCELLANEOUS) ×2 IMPLANT
TAPE UMBILICAL COTTON 1/8X30 (MISCELLANEOUS) ×2 IMPLANT
TOWEL GREEN STERILE (TOWEL DISPOSABLE) ×2 IMPLANT
TOWEL GREEN STERILE FF (TOWEL DISPOSABLE) ×2 IMPLANT
WATER STERILE IRR 1000ML POUR (IV SOLUTION) ×2 IMPLANT
YANKAUER SUCT BULB TIP NO VENT (SUCTIONS) ×2 IMPLANT

## 2021-12-06 NOTE — Anesthesia Preprocedure Evaluation (Addendum)
Anesthesia Evaluation  ?Patient identified by MRN, date of birth, ID band ?Patient awake ? ? ? ?Reviewed: ?Allergy & Precautions, NPO status , Patient's Chart, lab work & pertinent test results ? ?Airway ?Mallampati: I ? ?TM Distance: >3 FB ?Neck ROM: Full ? ? ? Dental ?no notable dental hx. ? ?  ?Pulmonary ?neg pulmonary ROS,  ?  ?Pulmonary exam normal ?breath sounds clear to auscultation ? ? ? ? ? ? Cardiovascular ?negative cardio ROS ?Normal cardiovascular exam ?Rhythm:Regular Rate:Normal ? ? ?  ?Neuro/Psych ?Chronic pain ? ?negative psych ROS  ? GI/Hepatic ?negative GI ROS, Neg liver ROS,   ?Endo/Other  ?negative endocrine ROS ? Renal/GU ?negative Renal ROS  ?negative genitourinary ?  ?Musculoskeletal ? ?(+) Arthritis , Osteoarthritis,   ? Abdominal ?  ?Peds ?negative pediatric ROS ?(+)  Hematology ?negative hematology ROS ?(+)   ?Anesthesia Other Findings ? ? Reproductive/Obstetrics ?negative OB ROS ? ?  ? ? ? ? ? ? ? ? ? ? ? ? ? ?  ?  ? ? ? ? ? ? ? ?Anesthesia Physical ?Anesthesia Plan ? ?ASA: 2 ? ?Anesthesia Plan: General  ? ?Post-op Pain Management: Dilaudid IV  ? ?Induction: Intravenous ? ?PONV Risk Score and Plan: 2 and Ondansetron, Dexamethasone and Treatment may vary due to age or medical condition ? ?Airway Management Planned: Oral ETT ? ?Additional Equipment:  ? ?Intra-op Plan:  ? ?Post-operative Plan: Extubation in OR ? ?Informed Consent: I have reviewed the patients History and Physical, chart, labs and discussed the procedure including the risks, benefits and alternatives for the proposed anesthesia with the patient or authorized representative who has indicated his/her understanding and acceptance.  ? ? ? ?Dental advisory given ? ?Plan Discussed with: CRNA and Surgeon ? ?Anesthesia Plan Comments:   ? ? ? ? ? ? ?Anesthesia Quick Evaluation ? ?

## 2021-12-06 NOTE — Anesthesia Postprocedure Evaluation (Signed)
Anesthesia Post Note ? ?Patient: [name] ? ?Procedure(s) Performed: ANTERIOR CERVICAL DECOMPRESSION AND FUSION CERVICAL FIVE- CERVICAL SIX WITH INSTRUMENTATION AND ALLOGRAFT (Spine Cervical) ? ?  ? ?Patient location during evaluation: PACU ?Anesthesia Type: General ?Level of consciousness: awake and alert ?Pain management: pain level controlled ?Vital Signs Assessment: post-procedure vital signs reviewed and stable ?Respiratory status: spontaneous breathing, nonlabored ventilation, respiratory function stable and patient connected to nasal cannula oxygen ?Cardiovascular status: blood pressure returned to baseline and stable ?Postop Assessment: no apparent nausea or vomiting ?Anesthetic complications: no ? ? ?No notable events documented. ? ?Last Vitals:  ?Vitals:  ? 12/06/21 1440 12/06/21 1455  ?BP: (!) 175/91 (!) 163/92  ?Pulse: 69 65  ?Resp: 13 14  ?Temp:  (!) 36.4 ?C  ?SpO2: 97% 96%  ?  ?Last Pain:  ?Vitals:  ? 12/06/21 1455  ?PainSc: 4   ? ? ?  ?  ?  ?  ?  ?  ? ?[name] ? ? ? ? ?

## 2021-12-06 NOTE — Op Note (Signed)
PATIENT NAME: Brett Hernandez  ? ?MEDICAL RECORD NO.:   [id number]  ?  ?DATE OF BIRTH: 04/11/1956 ?  ?DATE OF PROCEDURE: 12/06/2021  ?  ?                            OPERATIVE REPORT ?  ?  ?PREOPERATIVE DIAGNOSES: ?1. Bilateral cervical radiculopathy. ?2. 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  ?POSTOPERATIVE DIAGNOSES: ?1. Bilateral cervical radiculopathy. ?2. 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  ?PROCEDURE: ?1. Anterior cervical decompression and fusion C5/6 ?2. Placement of anterior instrumentation, C5/6 ?3. Insertion of interbody device x1 (52mm Titan intervertebral spacer). ?4. Intraoperative use of fluoroscopy. ?5. Use of morselized allograft - ViviGen. ?  ?SURGEON:  Phylliss Bob, MD ?  ?ASSISTANT:  Pricilla Holm, PA-C. ?  ?ANESTHESIA:  General endotracheal anesthesia. ?  ?COMPLICATIONS:  None. ?  ?DISPOSITION:  Stable. ?  ?ESTIMATED BLOOD LOSS:  Minimal. ?  ?INDICATIONS FOR SURGERY:  Briefly, Brett Hernandez  is a pleasant 37 -year- ?old patient, who did present to me with severe pain in his neck and bilateral arms.  The patient's MRI did reveal the findings noted above.  Given the ?ongoing rather debilitating pain and lack of improvement with ?appropriate treatment measures, we did discuss proceeding with the ?procedure noted above.  The patient was fully aware of the risks and ?limitations of surgery as outlined in my preoperative note. ?  ?OPERATIVE DETAILS:  On 12/06/2021, the patient was brought to ?surgery and general endotracheal anesthesia was administered.  The ?patient was placed supine on the hospital bed. The neck was gently ?extended.  All bony prominences were meticulously padded.  The neck was ?prepped and draped in the usual sterile fashion.  At this point, I did ?make a left-sided transverse incision.  The platysma was incised.  A ?Smith-Robinson approach was used and the anterior spine was identified. ?A self-retaining retractor was placed.  I then subperiosteally exposed ?the vertebral bodies from C5-C6.  Caspar pins were then  placed into the ?C5 and C6 vertebral bodies and distraction was applied.  A thorough and ?complete C5-6 intervertebral diskectomy was performed.  The posterior ?longitudinal ligament was identified and entered using a nerve hook.  I ?then used #1 followed by #2 Kerrison to perform a thorough and complete ?intervertebral diskectomy.  The spinal canal was thoroughly ?decompressed, as was the right and left neuroforamen.  The endplates were then prepared and the appropriate-sized intervertebral spacer was then packed ?with ViviGen and tamped into position in the usual fashion. The Caspar pins  ?then were removed and bone wax was placed in their place.  The appropriate-sized anterior cervical plate was placed over the anterior spine.  14 mm variable angle screws were placed, 2 in each vertebral body from C5-C6 ?for a total of 4 vertebral body screws.  The screws were then locked to ?the plate using the Cam locking mechanism.  I was very pleased with the ?final fluoroscopic images.  The wound was then irrigated.  The wound was ?then explored for any undue bleeding and there was no bleeding noted. ?The wound was then closed in layers using 2-0 Vicryl, followed by 4-0 ?Monocryl.  Benzoin and Steri-Strips were applied, followed by sterile ?dressing.  All instrument counts were correct at the termination of the ?procedure. ?  ?Of note, Pricilla Holm, PA-C, was my assistant throughout surgery, and ?did aid in retraction, placement of the hardware, suctioning, and closure from start to finish. ?  ?  ?  ?  Phylliss Bob, MD  ?

## 2021-12-06 NOTE — H&P (Signed)
? ? ? ?PREOPERATIVE H&P ? ?Chief Complaint: Bilateral arm pain ? ?HPI: ?Brett Hernandez is a 66 y.o. male who presents with ongoing pain in the bilateral arms ? 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 ?Patient has failed multiple forms of conservative care and continues to have pain (see office notes for additional details regarding the patient's full course of treatment) ? ?Past Medical History:  ?Diagnosis Date  ? Arthritis   ? Chronic pain   ? cervical, lumbar  ? Headache   ? ?Past Surgical History:  ?Procedure Laterality Date  ? ANTERIOR CRUCIATE LIGAMENT REPAIR    ? CARPAL TUNNEL RELEASE Bilateral   ? INGUINAL HERNIA REPAIR Bilateral   ? ROTATOR CUFF REPAIR    ? SPINAL CORD STIMULATOR IMPLANT  2009  ? in neck  ? TONSILLECTOMY    ? removed as a child  ? VASECTOMY    ? ?Social History  ? ?Socioeconomic History  ? Marital status: Married  ?  Spouse name: Chong Sicilian  ? Number of children: 2  ? Years of education: 14 years  ? Highest education level: Not on file  ?Occupational History  ?  Comment: [employer]  ? Occupation: produce specialist at [employer]  ?Tobacco Use  ? Smoking status: Never  ? Smokeless tobacco: Never  ?Vaping Use  ? Vaping Use: Never used  ?Substance and Sexual Activity  ? Alcohol use: Not Currently  ? Drug use: Never  ? Sexual activity: Not on file  ?Other Topics Concern  ? Not on file  ?Social History Narrative  ? Lives with wife.  ? Right-handed.  ? 3 cups caffeine per day.  ? ?Social Determinants of Health  ? ?Financial Resource Strain: Not on file  ?Food Insecurity: Not on file  ?Transportation Needs: Not on file  ?Physical Activity: Not on file  ?Stress: Not on file  ?Social Connections: Not on file  ? ?No family history on file. ?Allergies  ?Allergen Reactions  ? Grass Extracts [Gramineae Pollens]   ?  HAY FEVER  ? ?Prior to Admission medications   ?Medication Sig Start Date End Date Taking? Authorizing Provider  ?diclofenac (VOLTAREN) 75 MG EC tablet Take 75 mg by mouth 2 (two) times  daily. 10/31/21  Yes [provider]  ?gabapentin (NEURONTIN) 300 MG capsule Take 300 mg by mouth 3 (three) times daily. 10/11/21  Yes [provider]  ?HYDROcodone-acetaminophen (NORCO) 10-325 MG tablet Take 1 tablet by mouth every 6 (six) hours as needed for severe pain. 10/18/21  Yes [provider]  ?Multiple Vitamin (MULTIVITAMIN) tablet Take 1 tablet by mouth daily.   Yes [provider]  ?Omega-3 Fatty Acids (FISH OIL PO) Take 1 tablet by mouth daily.   Yes [provider]  ?oxymetazoline (AFRIN) 0.05 % nasal spray Place 1 spray into both nostrils 2 (two) times daily as needed for congestion.   Yes [provider]  ?SUMAtriptan (IMITREX) 25 MG tablet Take 1 tablet (25 mg total) by mouth every 2 (two) hours as needed for migraine. May repeat in 2 hours if headache persists or recurs. 11/16/21  Yes Marcial Pacas, MD  ?tiZANidine (ZANAFLEX) 4 MG tablet Take 4 mg by mouth 3 (three) times daily. 10/19/21  Yes [provider]  ?traZODone (DESYREL) 50 MG tablet Take 50-100 mg by mouth at bedtime. 10/31/21  Yes [provider]  ? ? ? ?All other systems have been reviewed and were otherwise negative with the exception of those mentioned in the  HPI and as above. ? ?Physical Exam: ?There were no vitals filed for this visit. ? ?There is no height or weight on file to calculate BMI. ? ?General: Alert, no acute distress ?Cardiovascular: No pedal edema ?Respiratory: No cyanosis, no use of accessory musculature ?Skin: No lesions in the area of chief complaint ?Neurologic: Sensation intact distally ?Psychiatric: Patient is competent for consent with normal mood and affect ?Lymphatic: No axillary or cervical lymphadenopathy ? ? 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 ? ?Norva Karvonen, MD ?12/06/2021 ?6:39 AM  ?

## 2021-12-06 NOTE — Progress Notes (Signed)
Verbal order received from Dr. Okey Dupre for Fentanyl 50 mcg and may repeat times one if needed for a total dosage of 100 mcg of Fentanyl. ?

## 2021-12-06 NOTE — Transfer of Care (Signed)
Immediate Anesthesia Transfer of Care Note ? ?Patient: Brett Hernandez ? ?Procedure(s) Performed: ANTERIOR CERVICAL DECOMPRESSION AND FUSION CERVICAL FIVE- CERVICAL SIX WITH INSTRUMENTATION AND ALLOGRAFT (Spine Cervical) ? ?Patient Location: PACU ? ?Anesthesia Type:General ? ?Level of Consciousness: drowsy and patient cooperative ? ?Airway & Oxygen Therapy: Patient Spontanous Breathing ? ?Post-op Assessment: Report given to RN and Post -op Vital signs reviewed and stable ? ?Post vital signs: Reviewed and stable ? ?Last Vitals:  ?Vitals Value Taken Time  ?BP 161/120 12/06/21 1426  ?Temp    ?Pulse 63 12/06/21 1426  ?Resp 13 12/06/21 1427  ?SpO2 95 % 12/06/21 1426  ?Vitals shown include unvalidated device data. ? ?Last Pain:  ?Vitals:  ? 12/06/21 1250  ?PainSc: 8   ?   ? ?Patients Stated Pain Goal: 0 (12/06/21 1250) ? ?Complications: No notable events documented. ?

## 2021-12-06 NOTE — Anesthesia Procedure Notes (Signed)
Procedure Name: Intubation ?Date/Time: 12/06/2021 1:09 PM ?Performed by: Rosiland Oz, CRNA ?Pre-anesthesia Checklist: Patient identified, Emergency Drugs available, Suction available, Patient being monitored and Timeout performed ?Patient Re-evaluated:Patient Re-evaluated prior to induction ?Oxygen Delivery Method: Circle system utilized ?Preoxygenation: Pre-oxygenation with 100% oxygen ?Induction Type: IV induction ?Ventilation: Mask ventilation without difficulty ?Laryngoscope Size: Hyacinth Meeker and 3 ?Grade View: Grade I ?Tube type: Oral ?Tube size: 7.5 mm ?Number of attempts: 1 ?Airway Equipment and Method: Stylet ?Placement Confirmation: ETT inserted through vocal cords under direct vision, positive ETCO2 and breath sounds checked- equal and bilateral ?Secured at: 21 cm ?Tube secured with: Tape ?Dental Injury: Teeth and Oropharynx as per pre-operative assessment  ? ? ? ? ?

## 2021-12-10 ENCOUNTER — Encounter (HOSPITAL_COMMUNITY): Payer: Self-pay | Admitting: Orthopedic Surgery

## 2022-01-15 DIAGNOSIS — G894 Chronic pain syndrome: Secondary | ICD-10-CM | POA: Diagnosis not present

## 2022-01-15 DIAGNOSIS — M47816 Spondylosis without myelopathy or radiculopathy, lumbar region: Secondary | ICD-10-CM | POA: Diagnosis not present

## 2022-01-15 DIAGNOSIS — M5451 Vertebrogenic low back pain: Secondary | ICD-10-CM | POA: Diagnosis not present

## 2022-01-15 DIAGNOSIS — M1611 Unilateral primary osteoarthritis, right hip: Secondary | ICD-10-CM | POA: Diagnosis not present

## 2022-01-16 ENCOUNTER — Telehealth: Payer: Self-pay | Admitting: Neurology

## 2022-01-16 ENCOUNTER — Other Ambulatory Visit: Payer: BC Managed Care – PPO

## 2022-01-16 ENCOUNTER — Ambulatory Visit
Admission: RE | Admit: 2022-01-16 | Discharge: 2022-01-16 | Disposition: A | Discharge status: Home / Self Care | Payer: BC Managed Care – PPO | Source: Ambulatory Visit | Attending: Neurology | Admitting: Neurology

## 2022-01-16 DIAGNOSIS — R519 Headache, unspecified: Secondary | ICD-10-CM

## 2022-01-16 NOTE — Telephone Encounter (Signed)
I spoke to the patient and provided him with the results. Reports a chronic nasal drip for years. He may mention to his PCP. ?

## 2022-01-16 NOTE — Telephone Encounter (Signed)
Please call patient, CT head of the brain showed no intracranial abnormality ?There is evidence of opacified right frontal sinus, few posterior ethmoid air cells bilaterally consistent with frontoethmoid para nasal sinus disease ? ?If he has a lot of nasal discharge, may talk with his primary care physician for treatment ? ? ? ?IMPRESSION: ?1. No evidence of acute intracranial abnormality. ?2. Frontoethmoidal paranasal sinus disease, as above. ?

## 2022-01-22 DIAGNOSIS — M47816 Spondylosis without myelopathy or radiculopathy, lumbar region: Secondary | ICD-10-CM | POA: Diagnosis not present

## 2022-01-24 DIAGNOSIS — M47816 Spondylosis without myelopathy or radiculopathy, lumbar region: Secondary | ICD-10-CM | POA: Diagnosis not present

## 2022-02-22 DIAGNOSIS — Z23 Encounter for immunization: Secondary | ICD-10-CM | POA: Diagnosis not present

## 2022-03-12 DIAGNOSIS — M7061 Trochanteric bursitis, right hip: Secondary | ICD-10-CM | POA: Diagnosis not present

## 2022-03-12 DIAGNOSIS — M7062 Trochanteric bursitis, left hip: Secondary | ICD-10-CM | POA: Diagnosis not present

## 2022-03-12 DIAGNOSIS — M1712 Unilateral primary osteoarthritis, left knee: Secondary | ICD-10-CM | POA: Diagnosis not present

## 2022-03-27 DIAGNOSIS — M25512 Pain in left shoulder: Secondary | ICD-10-CM | POA: Diagnosis not present

## 2022-03-28 DIAGNOSIS — M47816 Spondylosis without myelopathy or radiculopathy, lumbar region: Secondary | ICD-10-CM | POA: Diagnosis not present

## 2022-04-01 DIAGNOSIS — M160 Bilateral primary osteoarthritis of hip: Secondary | ICD-10-CM | POA: Diagnosis not present

## 2022-04-01 DIAGNOSIS — M1712 Unilateral primary osteoarthritis, left knee: Secondary | ICD-10-CM | POA: Diagnosis not present

## 2022-04-02 DIAGNOSIS — T85622A Displacement of permanent sutures, initial encounter: Secondary | ICD-10-CM | POA: Diagnosis not present

## 2022-04-02 DIAGNOSIS — G8918 Other acute postprocedural pain: Secondary | ICD-10-CM | POA: Diagnosis not present

## 2022-04-02 DIAGNOSIS — T84428A Displacement of other internal orthopedic devices, implants and grafts, initial encounter: Secondary | ICD-10-CM | POA: Diagnosis not present

## 2022-04-03 DIAGNOSIS — M47816 Spondylosis without myelopathy or radiculopathy, lumbar region: Secondary | ICD-10-CM | POA: Diagnosis not present

## 2022-04-10 DIAGNOSIS — M47816 Spondylosis without myelopathy or radiculopathy, lumbar region: Secondary | ICD-10-CM | POA: Diagnosis not present

## 2022-04-11 DIAGNOSIS — M47816 Spondylosis without myelopathy or radiculopathy, lumbar region: Secondary | ICD-10-CM | POA: Diagnosis not present

## 2022-06-21 ENCOUNTER — Other Ambulatory Visit: Payer: Self-pay | Admitting: Orthopedic Surgery

## 2022-06-21 DIAGNOSIS — M545 Low back pain, unspecified: Secondary | ICD-10-CM

## 2022-07-01 ENCOUNTER — Ambulatory Visit
Admission: RE | Admit: 2022-07-01 | Discharge: 2022-07-01 | Disposition: A | Discharge status: Home / Self Care | Payer: 59 | Source: Ambulatory Visit | Attending: Orthopedic Surgery | Admitting: Orthopedic Surgery

## 2022-07-01 DIAGNOSIS — M545 Low back pain, unspecified: Secondary | ICD-10-CM

## 2022-08-14 ENCOUNTER — Other Ambulatory Visit: Payer: Self-pay | Admitting: Orthopedic Surgery

## 2022-08-14 DIAGNOSIS — M542 Cervicalgia: Secondary | ICD-10-CM

## 2022-08-23 ENCOUNTER — Ambulatory Visit
Admission: RE | Admit: 2022-08-23 | Discharge: 2022-08-23 | Disposition: A | Discharge status: Home / Self Care | Payer: 59 | Source: Ambulatory Visit | Attending: Orthopedic Surgery | Admitting: Orthopedic Surgery

## 2022-08-23 DIAGNOSIS — M542 Cervicalgia: Secondary | ICD-10-CM

## 2022-08-23 MED ORDER — DIAZEPAM 5 MG PO TABS
10.0000 mg | ORAL_TABLET | Freq: Once | ORAL | Status: AC
  Administered 2022-08-23: 5 mg via ORAL

## 2022-08-23 MED ORDER — ONDANSETRON HCL 4 MG/2ML IJ SOLN: 4.0000 mg | Freq: Once | INTRAMUSCULAR | Status: DC | PRN

## 2022-08-23 MED ORDER — MEPERIDINE HCL 50 MG/ML IJ SOLN: 50.0000 mg | Freq: Once | INTRAMUSCULAR | Status: DC | PRN

## 2022-08-23 MED ORDER — IOPAMIDOL (ISOVUE-M 300) INJECTION 61%
10.0000 mL | Freq: Once | INTRAMUSCULAR | Status: AC
  Administered 2022-08-23: 10 mL via INTRA_ARTICULAR

## 2022-08-23 NOTE — Discharge Instructions (Signed)

## 2022-08-28 ENCOUNTER — Other Ambulatory Visit: Payer: Self-pay | Admitting: Orthopedic Surgery

## 2022-08-28 DIAGNOSIS — M545 Low back pain, unspecified: Secondary | ICD-10-CM

## 2022-09-17 ENCOUNTER — Ambulatory Visit
Admission: RE | Admit: 2022-09-17 | Discharge: 2022-09-17 | Disposition: A | Discharge status: Home / Self Care | Payer: 59 | Source: Ambulatory Visit | Attending: Orthopedic Surgery | Admitting: Orthopedic Surgery

## 2022-09-17 DIAGNOSIS — M545 Low back pain, unspecified: Secondary | ICD-10-CM

## 2022-09-17 MED ORDER — ONDANSETRON HCL 4 MG/2ML IJ SOLN: 4.0000 mg | Freq: Once | INTRAMUSCULAR | Status: DC | PRN

## 2022-09-17 MED ORDER — DIAZEPAM 5 MG PO TABS: 10.0000 mg | ORAL_TABLET | Freq: Once | ORAL | Status: DC

## 2022-09-17 MED ORDER — IOPAMIDOL (ISOVUE-M 200) INJECTION 41%
15.0000 mL | Freq: Once | INTRAMUSCULAR | Status: AC
  Administered 2022-09-17: 15 mL via INTRATHECAL

## 2022-09-17 MED ORDER — MEPERIDINE HCL 50 MG/ML IJ SOLN: 50.0000 mg | Freq: Once | INTRAMUSCULAR | Status: DC | PRN

## 2022-09-17 NOTE — Discharge Instructions (Signed)

## 2022-10-14 ENCOUNTER — Other Ambulatory Visit: Payer: Self-pay | Admitting: Orthopedic Surgery

## 2022-10-24 ENCOUNTER — Other Ambulatory Visit: Payer: Self-pay

## 2022-11-18 NOTE — Pre-Procedure Instructions (Signed)
Surgical Instructions    Your procedure is scheduled on Wednesday, March 13.  Report to [facility] Main Entrance "A" at 6:30 A.M., then check in with the Admitting office.  Call this number if you have problems the morning of surgery:  [phone]   If you have any questions prior to your surgery date call [phone]: Open Monday-Friday 8am-4pm If you experience any cold or flu symptoms such as cough, fever, chills, shortness of breath, etc. between now and your scheduled surgery, please notify us at the above number     Remember:  Do not eat after midnight the night before your surgery  You may drink clear liquids until 5:30AM the morning of your surgery.   Clear liquids allowed are: Water, Non-Citrus Juices (without pulp), Carbonated Beverages, Clear Tea, Black Coffee ONLY (NO MILK, CREAM OR POWDERED CREAMER of any kind), and Gatorade    Take these medicines the morning of surgery with A SIP OF WATER:  gabapentin (NEURONTIN)   IF Needed: oxyCODONE-acetaminophen (PERCOCET    As of today, STOP taking any Aspirin (unless otherwise instructed by your surgeon) Aleve, Naproxen, Ibuprofen, Motrin, Advil, Goody's, BC's, all herbal medications, fish oil, and all vitamins.             [facility] is not responsible for any belongings or valuables.    Do NOT Smoke (Tobacco/Vaping)  24 hours prior to your procedure  If you use a CPAP at night, you may bring your mask for your overnight stay.   Contacts, glasses, hearing aids, dentures or partials may not be worn into surgery, please bring cases for these belongings   For patients admitted to the hospital, discharge time will be determined by your treatment team.   Patients discharged the day of surgery will not be allowed to drive home, and someone needs to stay with them for 24 hours.   SURGICAL WAITING ROOM VISITATION Patients having surgery or a procedure may have no more than 2 support people in the waiting area - these  visitors may rotate.   Children under the age of 49 must have an adult with them who is not the patient. If the patient needs to stay at the hospital during part of their recovery, the visitor guidelines for inpatient rooms apply. Pre-op nurse will coordinate an appropriate time for 1 support person to accompany patient in pre-op.  This support person may not rotate.   Please refer to [email or web address] for the visitor guidelines for Inpatients (after your surgery is over and you are in a regular room).    Special instructions:    Oral Hygiene is also important to reduce your risk of infection.  Remember - BRUSH YOUR TEETH THE MORNING OF SURGERY WITH YOUR REGULAR TOOTHPASTE   [facility]- Preparing For Surgery  Before surgery, you can play an important role. Because skin is not sterile, your skin needs to be as free of germs as possible. You can reduce the number of germs on your skin by washing with CHG (chlorahexidine gluconate) Soap before surgery.  CHG is an antiseptic cleaner which kills germs and bonds with the skin to continue killing germs even after washing.     Please do not use if you have an allergy to CHG or antibacterial soaps. If your skin becomes reddened/irritated stop using the CHG.  Do not shave (including legs and underarms) for at least 48 hours prior to first CHG shower. It is OK to shave your face.  Please follow these instructions  carefully.     Shower the NIGHT BEFORE SURGERY and the MORNING OF SURGERY with CHG Soap.   If you chose to wash your hair, wash your hair first as usual with your normal shampoo. After you shampoo, rinse your hair and body thoroughly to remove the shampoo.  Then [employer] and genitals (private parts) with your normal soap and rinse thoroughly to remove soap.  After that Use CHG Soap as you would any other liquid soap. You can apply CHG directly to the skin and wash gently with a scrungie  or a clean washcloth.   Apply the CHG Soap to your body ONLY FROM THE NECK DOWN.  Do not use on open wounds or open sores. Avoid contact with your eyes, ears, mouth and genitals (private parts). Wash Face and genitals (private parts)  with your normal soap.   Wash thoroughly, paying special attention to the area where your surgery will be performed.  Thoroughly rinse your body with warm water from the neck down.  DO NOT shower/wash with your normal soap after using and rinsing off the CHG Soap.  Pat yourself dry with a CLEAN TOWEL.  Wear CLEAN PAJAMAS to bed the night before surgery  Place CLEAN SHEETS on your bed the night before your surgery  DO NOT SLEEP WITH PETS.   Day of Surgery:  Take a shower with CHG soap. Wear Clean/Comfortable clothing the morning of surgery Do not wear jewelry or makeup. Do not wear lotions, powders, perfumes/cologne or deodorant. Do not shave 48 hours prior to surgery.  Men may shave face and neck. Do not bring valuables to the hospital. Do not wear nail polish, gel polish, artificial nails, or any other type of covering on natural nails (fingers and toes) If you have artificial nails or gel coating that need to be removed by a nail salon, please have this removed prior to surgery. Artificial nails or gel coating may interfere with anesthesia's ability to adequately monitor your vital signs. Remember to brush your teeth WITH YOUR REGULAR TOOTHPASTE.    If you received a COVID test during your pre-op visit, it is requested that you wear a mask when out in public, stay away from anyone that may not be feeling well, and notify your surgeon if you develop symptoms. If you have been in contact with anyone that has tested positive in the last 10 days, please notify your surgeon.    Please read over the following fact sheets that you were given.

## 2022-11-19 ENCOUNTER — Encounter (HOSPITAL_COMMUNITY): Payer: Self-pay

## 2022-11-19 ENCOUNTER — Other Ambulatory Visit: Payer: Self-pay

## 2022-11-19 ENCOUNTER — Encounter (HOSPITAL_COMMUNITY)
Admission: RE | Admit: 2022-11-19 | Discharge: 2022-11-19 | Disposition: A | Discharge status: Home / Self Care | Payer: 59 | Source: Ambulatory Visit | Attending: Orthopedic Surgery | Admitting: Orthopedic Surgery

## 2022-11-19 VITALS — BP 136/80 | HR 69 | Temp 97.9°F | Resp 17 | Ht 68.0 in | Wt 166.5 lb

## 2022-11-19 DIAGNOSIS — Z01812 Encounter for preprocedural laboratory examination: Secondary | ICD-10-CM | POA: Diagnosis not present

## 2022-11-19 DIAGNOSIS — Z01818 Encounter for other preprocedural examination: Secondary | ICD-10-CM

## 2022-11-19 LAB — BASIC METABOLIC PANEL
Anion gap: 9 (ref 5–15)
BUN: 13 mg/dL (ref 8–23)
CO2: 27 mmol/L (ref 22–32)
Calcium: 9.2 mg/dL (ref 8.9–10.3)
Chloride: 103 mmol/L (ref 98–111)
Creatinine, Ser: 1.21 mg/dL (ref 0.61–1.24)
GFR, Estimated: >60 mL/min (ref >60)
Glucose, Bld: 125 mg/dL — ABNORMAL HIGH (ref 70–99)
Potassium: 4.2 mmol/L (ref 3.5–5.1)
Sodium: 139 mmol/L (ref 135–145)

## 2022-11-19 LAB — TYPE AND SCREEN
ABO/RH(D): B POS
Antibody Screen: NEGATIVE

## 2022-11-19 LAB — CBC
HCT: 43.2 % (ref 39.0–52.0)
Hemoglobin: 13.9 g/dL (ref 13.0–17.0)
MCH: 28.1 pg (ref 26.0–34.0)
MCHC: 32.2 g/dL (ref 30.0–36.0)
MCV: 87.4 fL (ref 80.0–100.0)
Platelets: 307 10*3/uL (ref 150–400)
RBC: 4.94 MIL/uL (ref 4.22–5.81)
RDW: 12.9 % (ref 11.5–15.5)
WBC: 9.1 10*3/uL (ref 4.0–10.5)
nRBC: 0 % (ref 0.0–0.2)

## 2022-11-19 LAB — SURGICAL PCR SCREEN
MRSA, PCR: NEGATIVE
Staphylococcus aureus: NEGATIVE

## 2022-11-19 NOTE — Progress Notes (Signed)
PCP - Deon Pilling, NP Cardiologist - Denies  PPM/ICD - Denies  Chest x-ray - Denies EKG - Denies Stress Test - Denies ECHO - Denies Cardiac Cath - Denies  Sleep Study - Denies  DM: Denies  Blood Thinner Instructions:N/A Aspirin Instructions:N/A  ERAS Protcol - Yes PRE-SURGERY Ensure or G2- Ensure  COVID TEST- N/A   Anesthesia review: No  Patient denies shortness of breath, fever, cough and chest pain at PAT appointment   All instructions explained to the patient, with a verbal understanding of the material. Patient agrees to go over the instructions while at home for a better understanding. The opportunity to ask questions was provided.

## 2022-11-21 ENCOUNTER — Other Ambulatory Visit: Payer: Self-pay

## 2022-11-26 ENCOUNTER — Ambulatory Visit: Payer: 59 | Admitting: Vascular Surgery

## 2022-11-26 ENCOUNTER — Encounter: Payer: Self-pay | Admitting: Vascular Surgery

## 2022-11-26 VITALS — BP 118/72 | HR 69 | Temp 97.8°F | Resp 18 | Ht 68.0 in | Wt 164.0 lb

## 2022-11-26 DIAGNOSIS — M4150 Other secondary scoliosis, site unspecified: Secondary | ICD-10-CM | POA: Diagnosis not present

## 2022-11-26 DIAGNOSIS — M4326 Fusion of spine, lumbar region: Secondary | ICD-10-CM

## 2022-11-26 DIAGNOSIS — M4156 Other secondary scoliosis, lumbar region: Secondary | ICD-10-CM | POA: Diagnosis not present

## 2022-11-26 NOTE — Progress Notes (Signed)
Patient name: Brett Hernandez MRN: [id number] DOB: 08-20-1956 Sex: male  REASON FOR CONSULT: ALIF L4-S1  HPI: Brett Hernandez is a 67 y.o. male, with chronic lower back pain who presents for evaluation of anterior spine exposure for L4-S1 ALIF.  In reviewing the notes the patient describes at least 6 months of worsening lower back pain.  His workup revealed lumbar degenerative scoliosis.  He has been evaluated by Dr. Lynann Bologna who has recommended an L4-L5 and L5-S1 anterior lumbar fusion in combination with a left-sided lateral interbody fusion from L1-L4.  In a staged fashion he would then bring him back for posterior spinal fusion and decompression.  His only previous abdominal surgery would be inguinal hernias back in the 1980s.  Past Medical History:  Diagnosis Date   Arthritis    Bilateral hip pain    Chronic pain    cervical, lumbar   Headache    Low back pain    Scoliosis    Spinal stenosis     Past Surgical History:  Procedure Laterality Date   ANTERIOR CERVICAL DECOMP/DISCECTOMY FUSION N/A 12/06/2021   Procedure: ANTERIOR CERVICAL DECOMPRESSION AND FUSION CERVICAL FIVE- CERVICAL SIX WITH INSTRUMENTATION AND ALLOGRAFT;  Surgeon: Phylliss Bob, MD;  Location: [facility];  Service: Orthopedics;  Laterality: N/A;   ANTERIOR CRUCIATE LIGAMENT REPAIR     CARPAL TUNNEL RELEASE Bilateral    INGUINAL HERNIA REPAIR Bilateral    ROTATOR CUFF REPAIR     SPINAL CORD STIMULATOR IMPLANT  2009   in neck   TONSILLECTOMY     removed as a child   VASECTOMY      History reviewed. No pertinent family history.  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   Right-handed.   3 cups caffeine per day.   Social Determinants of Health   Financial Resource Strain: Not on file  Food Insecurity: Not on file  Transportation Needs: Not on file  Physical Activity: Not on file  Stress: Not on file  Social Connections: Not on file  Intimate Partner Violence: Not on file    Allergies  Allergen Reactions   Grass Extracts [Gramineae Pollens]     HAY FEVER    Current Outpatient Medications  Medication Sig Dispense Refill   gabapentin (NEURONTIN) 300 MG capsule Take 300 mg by mouth 3 (three) times daily.     guaiFENesin (MUCINEX) 600 MG 12 hr tablet Take 600 mg by mouth at bedtime.     oxyCODONE-acetaminophen (PERCOCET) 7.5-325 MG tablet Take 1 tablet by mouth every 6 (six) hours as needed for moderate pain.     oxymetazoline (AFRIN) 0.05 % nasal spray Place 1 spray into both nostrils every evening.     traZODone (DESYREL) 50 MG tablet Take 125 mg by mouth at bedtime.     SUMAtriptan (IMITREX) 25 MG tablet Take  1 tablet (25 mg total) by mouth every 2 (two) hours as needed for migraine. May repeat in 2 hours if headache persists or recurs. (Patient not taking: Reported on 11/15/2022) 10 tablet 3   No current facility-administered medications for this visit.    REVIEW OF SYSTEMS:  '[X]'$  denotes positive finding, '[ ]'$  denotes negative finding Cardiac  Comments:  Chest pain or chest pressure:    Shortness of breath upon exertion:    Short of breath when lying flat:    Irregular heart rhythm:        Vascular    Pain in calf, thigh, or hip brought on by ambulation:    Pain in feet at night that wakes you up from your sleep:     Blood clot in your veins:    Leg swelling:         Pulmonary    Oxygen at home:    Productive cough:     Wheezing:          Neurologic    Sudden weakness in arms or legs:     Sudden numbness in arms or legs:     Sudden onset of difficulty speaking or slurred speech:    Temporary loss of vision in one eye:     Problems with dizziness:         Gastrointestinal    Blood in stool:     Vomited blood:         Genitourinary    Burning when urinating:     Blood in urine:        Psychiatric    Major depression:         Hematologic    Bleeding problems:    Problems with blood clotting too easily:        Skin    Rashes or ulcers:        Constitutional    Fever or chills:      PHYSICAL EXAM: Vitals:   11/26/22 1400  BP: 118/72  Pulse: 69  Resp: 18  Temp: 97.8 F (36.6 C)  TempSrc: Temporal  SpO2: 94%  Weight: 164 lb (74.4 kg)  Height: '5\' 8"'$  (1.727 m)    GENERAL: The patient is a well-nourished male, in no acute distress. The vital signs are documented above. CARDIAC: There is a regular rate and rhythm.  VASCULAR:  Palpable femoral pulses bilaterally Bilateral PT pulses palpable PULMONARY: No respiratory distress. ABDOMEN: Soft and non-tender. MUSCULOSKELETAL: There are no major deformities or cyanosis. NEUROLOGIC: No focal weakness or paresthesias are detected. SKIN: There are no ulcers or rashes noted. PSYCHIATRIC: The patient has a normal affect.  DATA:   CT Lumbar Spine Reviewed 09/17/22:    Assessment/Plan:  67 year old male with chronic lower back pain and degenerative scoliosis that presents for evaluation of anterior spine exposure for L4-S1 ALIF.  I discussed after reviewing his CT scan he should be a good candidate for anterior approach.  Discussed paramedian incision over the left rectus and then mobilizing the left rectus muscle and enter into the retroperitoneum and mobilize peritoneum and left ureter across midline.  Discussed mobilizing iliac artery and vein to get the disc space exposed from the front.  Discussed risk of injury to the above structures.  Look forward  to assisting Dr. Lynann Bologna.  All questions answered.   Marty Heck, MD [facility] Office: [phone]

## 2022-11-26 NOTE — Anesthesia Preprocedure Evaluation (Signed)
Anesthesia Evaluation  Patient identified by MRN, date of birth, ID band Patient awake    Reviewed: Allergy & Precautions, NPO status , Patient's Chart, lab work & pertinent test results  Airway Mallampati: II  TM Distance: >3 FB Neck ROM: Full    Dental  (+) Dental Advisory Given, Upper Dentures   Pulmonary neg pulmonary ROS   Pulmonary exam normal breath sounds clear to auscultation       Cardiovascular negative cardio ROS Normal cardiovascular exam Rhythm:Regular Rate:Normal     Neuro/Psych  Headaches Lumbar degenerative scoliosis measuring 25, with spinal stenosis spanning L1-S1, with a bilateral L5 pars defect, with prominent left side similar collapse across L2-L3 and L3-L4    GI/Hepatic negative GI ROS, Neg liver ROS,,,  Endo/Other  negative endocrine ROS    Renal/GU negative Renal ROS     Musculoskeletal  (+) Arthritis ,    Abdominal   Peds  Hematology negative hematology ROS (+)   Anesthesia Other Findings   Reproductive/Obstetrics                             Anesthesia Physical Anesthesia Plan  ASA: 2  Anesthesia Plan: General   Post-op Pain Management: Tylenol PO (pre-op)* and Ketamine IV*   Induction: Intravenous  PONV Risk Score and Plan: 3 and Midazolam, Propofol infusion, Dexamethasone and Ondansetron  Airway Management Planned: Oral ETT  Additional Equipment: ClearSight  Intra-op Plan:   Post-operative Plan: Extubation in OR  Informed Consent: I have reviewed the patients History and Physical, chart, labs and discussed the procedure including the risks, benefits and alternatives for the proposed anesthesia with the patient or authorized representative who has indicated his/her understanding and acceptance.     Dental advisory given  Plan Discussed with: CRNA  Anesthesia Plan Comments: (2 large bore PIV)       Anesthesia Quick Evaluation

## 2022-11-27 ENCOUNTER — Other Ambulatory Visit: Payer: Self-pay

## 2022-11-27 ENCOUNTER — Inpatient Hospital Stay (HOSPITAL_COMMUNITY): Payer: 59

## 2022-11-27 ENCOUNTER — Inpatient Hospital Stay (HOSPITAL_COMMUNITY): Payer: 59 | Admitting: Certified Registered Nurse Anesthetist

## 2022-11-27 ENCOUNTER — Inpatient Hospital Stay (HOSPITAL_COMMUNITY)
Admission: RE | Admit: 2022-11-27 | Discharge: 2022-11-29 | DRG: 455 | Disposition: A | Discharge status: Home / Self Care | Payer: 59 | Attending: Orthopedic Surgery | Admitting: Orthopedic Surgery

## 2022-11-27 ENCOUNTER — Encounter (HOSPITAL_COMMUNITY): Payer: Self-pay | Admitting: Orthopedic Surgery

## 2022-11-27 ENCOUNTER — Inpatient Hospital Stay (HOSPITAL_COMMUNITY)
Admission: RE | Disposition: A | Discharge status: Home / Self Care | Payer: Self-pay | Source: Home / Self Care | Attending: Orthopedic Surgery

## 2022-11-27 DIAGNOSIS — M4316 Spondylolisthesis, lumbar region: Secondary | ICD-10-CM | POA: Diagnosis not present

## 2022-11-27 DIAGNOSIS — M4317 Spondylolisthesis, lumbosacral region: Secondary | ICD-10-CM | POA: Diagnosis present

## 2022-11-27 DIAGNOSIS — Z9682 Presence of neurostimulator: Secondary | ICD-10-CM

## 2022-11-27 DIAGNOSIS — M4807 Spinal stenosis, lumbosacral region: Secondary | ICD-10-CM

## 2022-11-27 DIAGNOSIS — M4157 Other secondary scoliosis, lumbosacral region: Secondary | ICD-10-CM

## 2022-11-27 DIAGNOSIS — M48061 Spinal stenosis, lumbar region without neurogenic claudication: Secondary | ICD-10-CM | POA: Diagnosis present

## 2022-11-27 DIAGNOSIS — G8929 Other chronic pain: Secondary | ICD-10-CM | POA: Diagnosis present

## 2022-11-27 DIAGNOSIS — Z79899 Other long term (current) drug therapy: Secondary | ICD-10-CM

## 2022-11-27 DIAGNOSIS — J302 Other seasonal allergic rhinitis: Secondary | ICD-10-CM | POA: Diagnosis present

## 2022-11-27 DIAGNOSIS — M5416 Radiculopathy, lumbar region: Secondary | ICD-10-CM | POA: Diagnosis present

## 2022-11-27 DIAGNOSIS — M4126 Other idiopathic scoliosis, lumbar region: Secondary | ICD-10-CM

## 2022-11-27 DIAGNOSIS — M4186 Other forms of scoliosis, lumbar region: Secondary | ICD-10-CM | POA: Diagnosis not present

## 2022-11-27 DIAGNOSIS — Z9852 Vasectomy status: Secondary | ICD-10-CM

## 2022-11-27 DIAGNOSIS — M4156 Other secondary scoliosis, lumbar region: Secondary | ICD-10-CM | POA: Diagnosis present

## 2022-11-27 DIAGNOSIS — Z981 Arthrodesis status: Secondary | ICD-10-CM

## 2022-11-27 HISTORY — PX: ANTERIOR LAT LUMBAR FUSION: SHX1168

## 2022-11-27 HISTORY — PX: ABDOMINAL EXPOSURE: SHX5708

## 2022-11-27 LAB — ABO/RH: ABO/RH(D): B POS

## 2022-11-27 SURGERY — ANTERIOR LATERAL LUMBAR FUSION 3 LEVELS
Anesthesia: General

## 2022-11-27 MED ORDER — GABAPENTIN 300 MG PO CAPS
300.0000 mg | ORAL_CAPSULE | Freq: Three times a day (TID) | ORAL | Status: DC
  Administered 2022-11-27 – 2022-11-28 (×4): 300 mg via ORAL
  Filled 2022-11-27 (×4): qty 1

## 2022-11-27 MED ORDER — DEXAMETHASONE SODIUM PHOSPHATE 10 MG/ML IJ SOLN
INTRAMUSCULAR | Status: DC | PRN
  Administered 2022-11-27: 10 mg via INTRAVENOUS

## 2022-11-27 MED ORDER — ONDANSETRON HCL 4 MG/2ML IJ SOLN
INTRAMUSCULAR | Status: DC | PRN
  Administered 2022-11-27: 4 mg via INTRAVENOUS

## 2022-11-27 MED ORDER — DEXAMETHASONE SODIUM PHOSPHATE 10 MG/ML IJ SOLN
INTRAMUSCULAR | Status: AC
  Filled 2022-11-27: qty 1

## 2022-11-27 MED ORDER — POVIDONE-IODINE 7.5 % EX SOLN
Freq: Once | CUTANEOUS | Status: DC
  Filled 2022-11-27: qty 118

## 2022-11-27 MED ORDER — FENTANYL CITRATE (PF) 100 MCG/2ML IJ SOLN
25.0000 ug | INTRAMUSCULAR | Status: DC | PRN
  Administered 2022-11-27: 50 ug via INTRAVENOUS
  Administered 2022-11-27 (×2): 25 ug via INTRAVENOUS
  Administered 2022-11-27: 50 ug via INTRAVENOUS

## 2022-11-27 MED ORDER — LACTATED RINGERS IV SOLN: INTRAVENOUS | Status: DC

## 2022-11-27 MED ORDER — ACETAMINOPHEN 500 MG PO TABS
1000.0000 mg | ORAL_TABLET | Freq: Once | ORAL | Status: AC
  Administered 2022-11-27: 500 mg via ORAL
  Filled 2022-11-27: qty 2

## 2022-11-27 MED ORDER — 0.9 % SODIUM CHLORIDE (POUR BTL) OPTIME
TOPICAL | Status: DC | PRN
  Administered 2022-11-27 (×2): 1000 mL

## 2022-11-27 MED ORDER — DOCUSATE SODIUM 100 MG PO CAPS
100.0000 mg | ORAL_CAPSULE | Freq: Two times a day (BID) | ORAL | Status: DC
  Administered 2022-11-27 – 2022-11-28 (×2): 100 mg via ORAL
  Filled 2022-11-27 (×2): qty 1

## 2022-11-27 MED ORDER — PROPOFOL 500 MG/50ML IV EMUL
INTRAVENOUS | Status: DC | PRN
  Administered 2022-11-27: 25 ug/kg/min via INTRAVENOUS
  Administered 2022-11-27: 125 ug/kg/min via INTRAVENOUS

## 2022-11-27 MED ORDER — BISACODYL 5 MG PO TBEC
5.0000 mg | DELAYED_RELEASE_TABLET | Freq: Every day | ORAL | Status: DC | PRN
  Administered 2022-11-28: 5 mg via ORAL
  Filled 2022-11-27: qty 1

## 2022-11-27 MED ORDER — KETAMINE HCL 50 MG/5ML IJ SOSY
PREFILLED_SYRINGE | INTRAMUSCULAR | Status: AC
  Filled 2022-11-27: qty 5

## 2022-11-27 MED ORDER — LIDOCAINE 2% (20 MG/ML) 5 ML SYRINGE
INTRAMUSCULAR | Status: DC | PRN
  Administered 2022-11-27: 80 mg via INTRAVENOUS

## 2022-11-27 MED ORDER — METHOCARBAMOL 500 MG PO TABS
500.0000 mg | ORAL_TABLET | Freq: Four times a day (QID) | ORAL | Status: DC | PRN
  Administered 2022-11-27 – 2022-11-28 (×3): 500 mg via ORAL
  Administered 2022-11-28: 1000 mg via ORAL
  Administered 2022-11-29: 500 mg via ORAL
  Filled 2022-11-27 (×2): qty 1
  Filled 2022-11-27: qty 2
  Filled 2022-11-27 (×2): qty 1

## 2022-11-27 MED ORDER — KETAMINE HCL 10 MG/ML IJ SOLN
INTRAMUSCULAR | Status: DC | PRN
  Administered 2022-11-27: 30 mg via INTRAVENOUS
  Administered 2022-11-27: 20 mg via INTRAVENOUS

## 2022-11-27 MED ORDER — CEFAZOLIN SODIUM 1 G IJ SOLR
INTRAMUSCULAR | Status: AC
  Filled 2022-11-27: qty 20

## 2022-11-27 MED ORDER — CEFAZOLIN SODIUM-DEXTROSE 2-4 GM/100ML-% IV SOLN
2.0000 g | Freq: Three times a day (TID) | INTRAVENOUS | Status: AC
  Administered 2022-11-27: 2 g via INTRAVENOUS
  Filled 2022-11-27: qty 100

## 2022-11-27 MED ORDER — ENSURE PRE-SURGERY PO LIQD
296.0000 mL | Freq: Once | ORAL | Status: AC
  Administered 2022-11-28: 296 mL via ORAL
  Filled 2022-11-27: qty 296

## 2022-11-27 MED ORDER — BUPIVACAINE-EPINEPHRINE 0.25% -1:200000 IJ SOLN
INTRAMUSCULAR | Status: DC | PRN
  Administered 2022-11-27: 30 mL

## 2022-11-27 MED ORDER — SENNOSIDES-DOCUSATE SODIUM 8.6-50 MG PO TABS
1.0000 | ORAL_TABLET | Freq: Every evening | ORAL | Status: DC | PRN
  Administered 2022-11-27: 1 via ORAL
  Filled 2022-11-27: qty 1

## 2022-11-27 MED ORDER — ONDANSETRON HCL 4 MG/2ML IJ SOLN: 4.0000 mg | Freq: Four times a day (QID) | INTRAMUSCULAR | Status: DC | PRN

## 2022-11-27 MED ORDER — THROMBIN (RECOMBINANT) 20000 UNITS EX SOLR
CUTANEOUS | Status: AC
  Filled 2022-11-27: qty 20000

## 2022-11-27 MED ORDER — ACETAMINOPHEN 10 MG/ML IV SOLN
INTRAVENOUS | Status: DC | PRN
  Administered 2022-11-27: 1000 mg via INTRAVENOUS

## 2022-11-27 MED ORDER — BUPIVACAINE-EPINEPHRINE (PF) 0.25% -1:200000 IJ SOLN
INTRAMUSCULAR | Status: AC
  Filled 2022-11-27: qty 30

## 2022-11-27 MED ORDER — POTASSIUM CHLORIDE IN NACL 20-0.9 MEQ/L-% IV SOLN: INTRAVENOUS | Status: DC

## 2022-11-27 MED ORDER — ORAL CARE MOUTH RINSE: 15.0000 mL | Freq: Once | OROMUCOSAL | Status: AC

## 2022-11-27 MED ORDER — ZOLPIDEM TARTRATE 5 MG PO TABS: 5.0000 mg | ORAL_TABLET | Freq: Every evening | ORAL | Status: DC | PRN

## 2022-11-27 MED ORDER — TRAZODONE HCL 50 MG PO TABS
125.0000 mg | ORAL_TABLET | Freq: Every day | ORAL | Status: DC
  Administered 2022-11-27 – 2022-11-28 (×2): 125 mg via ORAL
  Filled 2022-11-27 (×2): qty 1

## 2022-11-27 MED ORDER — HYDROMORPHONE HCL 1 MG/ML IJ SOLN
INTRAMUSCULAR | Status: AC
  Filled 2022-11-27: qty 1

## 2022-11-27 MED ORDER — PHENYLEPHRINE HCL-NACL 20-0.9 MG/250ML-% IV SOLN
INTRAVENOUS | Status: DC | PRN
  Administered 2022-11-27: 25 ug/min via INTRAVENOUS

## 2022-11-27 MED ORDER — CHLORHEXIDINE GLUCONATE CLOTH 2 % EX PADS: 6.0000 | MEDICATED_PAD | Freq: Once | CUTANEOUS | Status: DC

## 2022-11-27 MED ORDER — ONDANSETRON HCL 4 MG PO TABS: 4.0000 mg | ORAL_TABLET | Freq: Four times a day (QID) | ORAL | Status: DC | PRN

## 2022-11-27 MED ORDER — MORPHINE SULFATE (PF) 2 MG/ML IV SOLN
1.0000 mg | INTRAVENOUS | Status: DC | PRN
  Administered 2022-11-27 – 2022-11-28 (×5): 2 mg via INTRAVENOUS
  Filled 2022-11-27 (×4): qty 1

## 2022-11-27 MED ORDER — CHLORHEXIDINE GLUCONATE 0.12 % MT SOLN
15.0000 mL | Freq: Once | OROMUCOSAL | Status: AC
  Administered 2022-11-27: 15 mL via OROMUCOSAL
  Filled 2022-11-27: qty 15

## 2022-11-27 MED ORDER — PROPOFOL 10 MG/ML IV BOLUS
INTRAVENOUS | Status: AC
  Filled 2022-11-27: qty 20

## 2022-11-27 MED ORDER — ACETAMINOPHEN 325 MG PO TABS: 650.0000 mg | ORAL_TABLET | ORAL | Status: DC | PRN

## 2022-11-27 MED ORDER — FENTANYL CITRATE (PF) 250 MCG/5ML IJ SOLN
INTRAMUSCULAR | Status: AC
  Filled 2022-11-27: qty 5

## 2022-11-27 MED ORDER — ALUM & MAG HYDROXIDE-SIMETH 200-200-20 MG/5ML PO SUSP: 30.0000 mL | Freq: Four times a day (QID) | ORAL | Status: DC | PRN

## 2022-11-27 MED ORDER — MENTHOL 3 MG MT LOZG: 1.0000 | LOZENGE | OROMUCOSAL | Status: DC | PRN

## 2022-11-27 MED ORDER — EPHEDRINE SULFATE-NACL 50-0.9 MG/10ML-% IV SOSY
PREFILLED_SYRINGE | INTRAVENOUS | Status: DC | PRN
  Administered 2022-11-27 (×5): 5 mg via INTRAVENOUS

## 2022-11-27 MED ORDER — SUGAMMADEX SODIUM 200 MG/2ML IV SOLN
INTRAVENOUS | Status: DC | PRN
  Administered 2022-11-27: 100 mg via INTRAVENOUS
  Administered 2022-11-27: 200 mg via INTRAVENOUS

## 2022-11-27 MED ORDER — LIDOCAINE 2% (20 MG/ML) 5 ML SYRINGE
INTRAMUSCULAR | Status: AC
  Filled 2022-11-27: qty 5

## 2022-11-27 MED ORDER — ACETAMINOPHEN 500 MG PO TABS
1000.0000 mg | ORAL_TABLET | Freq: Once | ORAL | Status: AC
  Administered 2022-11-28: 1000 mg via ORAL
  Filled 2022-11-27: qty 2

## 2022-11-27 MED ORDER — ROCURONIUM BROMIDE 10 MG/ML (PF) SYRINGE
PREFILLED_SYRINGE | INTRAVENOUS | Status: AC
  Filled 2022-11-27: qty 10

## 2022-11-27 MED ORDER — MIDAZOLAM HCL 2 MG/2ML IJ SOLN
INTRAMUSCULAR | Status: AC
  Filled 2022-11-27: qty 2

## 2022-11-27 MED ORDER — OXYCODONE-ACETAMINOPHEN 7.5-325 MG PO TABS
1.0000 | ORAL_TABLET | ORAL | Status: DC | PRN
  Administered 2022-11-27 – 2022-11-29 (×9): 2 via ORAL
  Filled 2022-11-27 (×9): qty 2

## 2022-11-27 MED ORDER — LACTATED RINGERS IV SOLN: INTRAVENOUS | Status: DC | PRN

## 2022-11-27 MED ORDER — MIDAZOLAM HCL 2 MG/2ML IJ SOLN
INTRAMUSCULAR | Status: DC | PRN
  Administered 2022-11-27: 2 mg via INTRAVENOUS

## 2022-11-27 MED ORDER — OXYCODONE HCL ER 10 MG PO T12A
10.0000 mg | EXTENDED_RELEASE_TABLET | Freq: Two times a day (BID) | ORAL | Status: DC
  Administered 2022-11-27 – 2022-11-28 (×2): 10 mg via ORAL
  Filled 2022-11-27 (×2): qty 1

## 2022-11-27 MED ORDER — ACETAMINOPHEN 650 MG RE SUPP: 650.0000 mg | RECTAL | Status: DC | PRN

## 2022-11-27 MED ORDER — SODIUM CHLORIDE (PF) 0.9 % IJ SOLN
INTRAMUSCULAR | Status: AC
  Filled 2022-11-27: qty 10

## 2022-11-27 MED ORDER — GLYCOPYRROLATE PF 0.2 MG/ML IJ SOSY
PREFILLED_SYRINGE | INTRAMUSCULAR | Status: DC | PRN
  Administered 2022-11-27: .2 mg via INTRAVENOUS

## 2022-11-27 MED ORDER — HYDROMORPHONE HCL 1 MG/ML IJ SOLN
0.2500 mg | INTRAMUSCULAR | Status: DC | PRN
  Administered 2022-11-27: 0.5 mg via INTRAVENOUS
  Administered 2022-11-27 (×2): 0.25 mg via INTRAVENOUS

## 2022-11-27 MED ORDER — PHENYLEPHRINE 80 MCG/ML (10ML) SYRINGE FOR IV PUSH (FOR BLOOD PRESSURE SUPPORT)
PREFILLED_SYRINGE | INTRAVENOUS | Status: AC
  Filled 2022-11-27: qty 10

## 2022-11-27 MED ORDER — CEFAZOLIN SODIUM-DEXTROSE 2-4 GM/100ML-% IV SOLN
2.0000 g | INTRAVENOUS | Status: AC
  Administered 2022-11-28: 2 g via INTRAVENOUS

## 2022-11-27 MED ORDER — EPHEDRINE 5 MG/ML INJ
INTRAVENOUS | Status: AC
  Filled 2022-11-27: qty 5

## 2022-11-27 MED ORDER — FENTANYL CITRATE (PF) 100 MCG/2ML IJ SOLN
INTRAMUSCULAR | Status: AC
  Filled 2022-11-27: qty 2

## 2022-11-27 MED ORDER — PHENOL 1.4 % MT LIQD: 1.0000 | OROMUCOSAL | Status: DC | PRN

## 2022-11-27 MED ORDER — THROMBIN 20000 UNITS EX SOLR
CUTANEOUS | Status: DC | PRN
  Administered 2022-11-27: 20 mL via TOPICAL

## 2022-11-27 MED ORDER — PROPOFOL 10 MG/ML IV BOLUS
INTRAVENOUS | Status: DC | PRN
  Administered 2022-11-27 (×2): 30 mg via INTRAVENOUS
  Administered 2022-11-27: 40 mg via INTRAVENOUS
  Administered 2022-11-27: 170 mg via INTRAVENOUS
  Administered 2022-11-27: 30 mg via INTRAVENOUS
  Administered 2022-11-27: 40 mg via INTRAVENOUS
  Administered 2022-11-27 (×2): 30 mg via INTRAVENOUS
  Administered 2022-11-27: 50 mg via INTRAVENOUS
  Administered 2022-11-27 (×2): 30 mg via INTRAVENOUS

## 2022-11-27 MED ORDER — ONDANSETRON HCL 4 MG/2ML IJ SOLN
INTRAMUSCULAR | Status: AC
  Filled 2022-11-27: qty 2

## 2022-11-27 MED ORDER — HYDROCODONE-ACETAMINOPHEN 5-325 MG PO TABS: 1.0000 | ORAL_TABLET | ORAL | Status: DC | PRN

## 2022-11-27 MED ORDER — ACETAMINOPHEN 10 MG/ML IV SOLN
INTRAVENOUS | Status: AC
  Filled 2022-11-27: qty 100

## 2022-11-27 MED ORDER — PROMETHAZINE HCL 25 MG/ML IJ SOLN: 6.2500 mg | INTRAMUSCULAR | Status: DC | PRN

## 2022-11-27 MED ORDER — CEFAZOLIN SODIUM-DEXTROSE 2-4 GM/100ML-% IV SOLN
2.0000 g | INTRAVENOUS | Status: AC
  Administered 2022-11-27 (×2): 2 g via INTRAVENOUS
  Filled 2022-11-27: qty 100

## 2022-11-27 MED ORDER — SODIUM CHLORIDE 0.9 % IV SOLN: 250.0000 mL | INTRAVENOUS | Status: DC

## 2022-11-27 MED ORDER — SODIUM CHLORIDE 0.9% FLUSH
3.0000 mL | Freq: Two times a day (BID) | INTRAVENOUS | Status: DC
  Administered 2022-11-28: 3 mL via INTRAVENOUS

## 2022-11-27 MED ORDER — FENTANYL CITRATE (PF) 250 MCG/5ML IJ SOLN
INTRAMUSCULAR | Status: DC | PRN
  Administered 2022-11-27 (×6): 50 ug via INTRAVENOUS
  Administered 2022-11-27: 100 ug via INTRAVENOUS
  Administered 2022-11-27 (×2): 50 ug via INTRAVENOUS

## 2022-11-27 MED ORDER — MORPHINE SULFATE (PF) 2 MG/ML IV SOLN
INTRAVENOUS | Status: AC
  Filled 2022-11-27: qty 1

## 2022-11-27 MED ORDER — ALBUMIN HUMAN 5 % IV SOLN: INTRAVENOUS | Status: DC | PRN

## 2022-11-27 MED ORDER — FLEET ENEMA 7-19 GM/118ML RE ENEM: 1.0000 | ENEMA | Freq: Once | RECTAL | Status: DC | PRN

## 2022-11-27 MED ORDER — METHOCARBAMOL 1000 MG/10ML IJ SOLN: 500.0000 mg | Freq: Four times a day (QID) | INTRAVENOUS | Status: DC | PRN

## 2022-11-27 MED ORDER — PROPOFOL 1000 MG/100ML IV EMUL
INTRAVENOUS | Status: AC
  Filled 2022-11-27: qty 100

## 2022-11-27 MED ORDER — SODIUM CHLORIDE 0.9% FLUSH: 3.0000 mL | INTRAVENOUS | Status: DC | PRN

## 2022-11-27 MED ORDER — ROCURONIUM BROMIDE 10 MG/ML (PF) SYRINGE
PREFILLED_SYRINGE | INTRAVENOUS | Status: DC | PRN
  Administered 2022-11-27: 70 mg via INTRAVENOUS
  Administered 2022-11-27: 50 mg via INTRAVENOUS
  Administered 2022-11-27: 40 mg via INTRAVENOUS

## 2022-11-27 SURGICAL SUPPLY — 91 items
AGENT HMST KT MTR STRL THRMB (HEMOSTASIS)
APL SKNCLS STERI-STRIP NONHPOA (GAUZE/BANDAGES/DRESSINGS) ×4
APPLIER CLIP 11 MED OPEN (CLIP) ×2
APR CLP MED 11 20 MLT OPN (CLIP) ×2
BAG COUNTER SPONGE SURGICOUNT (BAG) ×4 IMPLANT
BAG SPNG CNTER NS LX DISP (BAG) ×4
BENZOIN TINCTURE PRP APPL 2/3 (GAUZE/BANDAGES/DRESSINGS) IMPLANT
BLADE CLIPPER SURG (BLADE) IMPLANT
BLADE SURG 10 STRL SS (BLADE) ×2 IMPLANT
CATH ROBINSON RED A/P 18FR (CATHETERS) IMPLANT
CLIP APPLIE 11 MED OPEN (CLIP) ×2 IMPLANT
CLIP LIGATING EXTRA MED SLVR (CLIP) IMPLANT
CORD BIPOLAR FORCEPS 12FT (ELECTRODE) ×2 IMPLANT
COVER SURGICAL LIGHT HANDLE (MISCELLANEOUS) ×2 IMPLANT
DRAPE C-ARM 42X72 X-RAY (DRAPES) ×4 IMPLANT
DRAPE C-ARMOR (DRAPES) ×2 IMPLANT
DRAPE POUCH INSTRU U-SHP 10X18 (DRAPES) ×2 IMPLANT
DRAPE SURG 17X23 STRL (DRAPES) ×2 IMPLANT
DRAPE U-SHAPE 47X51 STRL (DRAPES) ×2 IMPLANT
DRSG MEPILEX POST OP 4X8 (GAUZE/BANDAGES/DRESSINGS) ×2 IMPLANT
DURAPREP 26ML APPLICATOR (WOUND CARE) ×2 IMPLANT
ELECT BLADE 4.0 EZ CLEAN MEGAD (MISCELLANEOUS) ×4
ELECT CAUTERY BLADE 6.4 (BLADE) ×2 IMPLANT
ELECT REM PT RETURN 9FT ADLT (ELECTROSURGICAL) ×2
ELECTRODE BLDE 4.0 EZ CLN MEGD (MISCELLANEOUS) ×4 IMPLANT
ELECTRODE REM PT RTRN 9FT ADLT (ELECTROSURGICAL) ×2 IMPLANT
FORCEPS BIPOLAR BAYONET STR (FORCEP) IMPLANT
GAUZE 4X4 16PLY ~~LOC~~+RFID DBL (SPONGE) ×4 IMPLANT
GAUZE SPONGE 4X4 12PLY STRL (GAUZE/BANDAGES/DRESSINGS) IMPLANT
GEL DBM PROPEL 5ML (Putty) IMPLANT
GLOVE BIO SURGEON STRL SZ7 (GLOVE) ×4 IMPLANT
GLOVE BIO SURGEON STRL SZ7.5 (GLOVE) ×2 IMPLANT
GLOVE BIO SURGEON STRL SZ8 (GLOVE) ×2 IMPLANT
GLOVE BIOGEL PI IND STRL 7.0 (GLOVE) ×4 IMPLANT
GLOVE BIOGEL PI IND STRL 8 (GLOVE) ×4 IMPLANT
GLOVE SURG ENC MOIS LTX SZ6.5 (GLOVE) ×4 IMPLANT
GOWN STRL REUS W/ TWL LRG LVL3 (GOWN DISPOSABLE) ×6 IMPLANT
GOWN STRL REUS W/ TWL XL LVL3 (GOWN DISPOSABLE) ×2 IMPLANT
GOWN STRL REUS W/TWL LRG LVL3 (GOWN DISPOSABLE) ×6
GOWN STRL REUS W/TWL XL LVL3 (GOWN DISPOSABLE) ×2
INSERT FOGARTY 61MM (MISCELLANEOUS) IMPLANT
INSERT FOGARTY SM (MISCELLANEOUS) IMPLANT
KIT BASIN OR (CUSTOM PROCEDURE TRAY) ×2 IMPLANT
KIT DILATOR XLIF 5 (KITS) IMPLANT
KIT SURGICAL ACCESS MAXCESS (KITS) IMPLANT
KIT TURNOVER KIT B (KITS) ×2 IMPLANT
MODULE NVM5 NEXT GEN EMG (NEEDLE) IMPLANT
NDL HYPO 25GX1X1/2 BEV (NEEDLE) ×2 IMPLANT
NEEDLE HYPO 25GX1X1/2 BEV (NEEDLE) ×2 IMPLANT
NS IRRIG 1000ML POUR BTL (IV SOLUTION) ×2 IMPLANT
PACK LAMINECTOMY ORTHO (CUSTOM PROCEDURE TRAY) ×2 IMPLANT
PACK UNIVERSAL I (CUSTOM PROCEDURE TRAY) ×2 IMPLANT
PAD ARMBOARD 7.5X6 YLW CONV (MISCELLANEOUS) ×8 IMPLANT
PUTTY BONE DBX 2.5 MIS (Bone Implant) IMPLANT
PUTTY BONE DBX 5CC MIX (Putty) IMPLANT
PUTTY DBM INSTAFILL CART 5CC (Putty) IMPLANT
SPACER ALIF EXP MAG 29X39X9 8D (Spacer) IMPLANT
SPACER ALIF MAG 29X39X10 8D (Spacer) IMPLANT
SPACER RISE-L 18X50 7-14MM (Spacer) IMPLANT
SPACER RISE-L 18X55 7-14MM (Spacer) IMPLANT
SPONGE INTESTINAL PEANUT (DISPOSABLE) ×8 IMPLANT
SPONGE SURGIFOAM ABS GEL 100 (HEMOSTASIS) IMPLANT
SPONGE T-LAP 18X18 ~~LOC~~+RFID (SPONGE) ×2 IMPLANT
SPONGE T-LAP 4X18 ~~LOC~~+RFID (SPONGE) ×2 IMPLANT
STRIP CLOSURE SKIN 1/2X4 (GAUZE/BANDAGES/DRESSINGS) IMPLANT
SURGIFLO W/THROMBIN 8M KIT (HEMOSTASIS) IMPLANT
SUT MNCRL AB 4-0 PS2 18 (SUTURE) ×2 IMPLANT
SUT PROLENE 4 0 RB 1 (SUTURE)
SUT PROLENE 4-0 RB1 .5 CRCL 36 (SUTURE) IMPLANT
SUT PROLENE 5 0 C 1 24 (SUTURE) IMPLANT
SUT PROLENE 5 0 CC1 (SUTURE) IMPLANT
SUT PROLENE 6 0 C 1 30 (SUTURE) IMPLANT
SUT PROLENE 6 0 CC (SUTURE) IMPLANT
SUT SILK 0 TIES 10X30 (SUTURE) IMPLANT
SUT SILK 2 0 TIES 10X30 (SUTURE) ×2 IMPLANT
SUT SILK 2 0SH CR/8 30 (SUTURE) IMPLANT
SUT SILK 3 0 TIES 17X18 (SUTURE)
SUT SILK 3 0SH CR/8 30 (SUTURE) IMPLANT
SUT SILK 3-0 18XBRD TIE BLK (SUTURE) IMPLANT
SUT VIC AB 1 CT1 27 (SUTURE) ×4
SUT VIC AB 1 CT1 27XBRD ANBCTR (SUTURE) ×4 IMPLANT
SUT VIC AB 2-0 CT2 18 VCP726D (SUTURE) ×4 IMPLANT
SYR BULB IRRIG 60ML STRL (SYRINGE) ×2 IMPLANT
TAPE CLOTH SURG 6X10 WHT LF (GAUZE/BANDAGES/DRESSINGS) ×4 IMPLANT
TIP CART INSTAFILL GL 5CC (ORTHOPEDIC DISPOSABLE SUPPLIES) IMPLANT
TIP CONICAL INSTAFILL (ORTHOPEDIC DISPOSABLE SUPPLIES) IMPLANT
TOWEL GREEN STERILE (TOWEL DISPOSABLE) ×4 IMPLANT
TOWEL GREEN STERILE FF (TOWEL DISPOSABLE) ×2 IMPLANT
TRAY FOLEY MTR SLVR 16FR STAT (SET/KITS/TRAYS/PACK) ×2 IMPLANT
WATER STERILE IRR 1000ML POUR (IV SOLUTION) ×2 IMPLANT
YANKAUER SUCT BULB TIP NO VENT (SUCTIONS) ×2 IMPLANT

## 2022-11-27 NOTE — Anesthesia Postprocedure Evaluation (Signed)
Anesthesia Post Note  Patient: Brett Hernandez  Procedure(s) Performed: LEFT-SIDED LUMBAR 1- LUMBAR 2, LUMBAR 2- LUMBAR 3, LUMBAR 3- LUMBAR 4 LATERAL INTERBODY FUSION WITH INSTRUMENTATION AND ALLOGRAFT.  LUMBAR 4- LUMBAR 5, LUMBAR 5- SACRUM 1 ANTERIOR LUMBAR INTERBODY FUSION WITH INSTRUMENTATION AND ALLOGRAFT (Left) ABDOMINAL EXPOSURE     Patient location during evaluation: PACU Anesthesia Type: General Level of consciousness: awake and alert Pain management: pain level controlled Vital Signs Assessment: post-procedure vital signs reviewed and stable Respiratory status: spontaneous breathing, nonlabored ventilation, respiratory function stable and patient connected to nasal cannula oxygen Cardiovascular status: blood pressure returned to baseline and stable Postop Assessment: no apparent nausea or vomiting Anesthetic complications: no   No notable events documented.  Last Vitals:  Vitals:   11/27/22 1830 11/27/22 1921  BP: (!) 155/86 (!) 143/86  Pulse: 67 60  Resp: 18 18  Temp:  36.8 C  SpO2: 97% 95%    Last Pain:  Vitals:   11/27/22 1921  TempSrc: Oral  PainSc:                  [place] Lighter

## 2022-11-27 NOTE — Anesthesia Procedure Notes (Signed)
Procedure Name: Intubation Date/Time: 11/27/2022 8:52 AM  Performed by: Michele Rockers, CRNAPre-anesthesia Checklist: Patient identified, Patient being monitored, Timeout performed, Emergency Drugs available and Suction available Patient Re-evaluated:Patient Re-evaluated prior to induction Oxygen Delivery Method: Circle System Utilized Preoxygenation: Pre-oxygenation with 100% oxygen Induction Type: IV induction Ventilation: Mask ventilation without difficulty Laryngoscope Size: Miller and 2 Grade View: Grade I Tube type: Oral Tube size: 7.5 mm Number of attempts: 1 Airway Equipment and Method: Stylet Placement Confirmation: ETT inserted through vocal cords under direct vision, positive ETCO2 and breath sounds checked- equal and bilateral Secured at: 22 cm Tube secured with: Tape Dental Injury: Teeth and Oropharynx as per pre-operative assessment

## 2022-11-27 NOTE — Op Note (Signed)
PATIENT NAME: Brett Hernandez [facility] RECORD NO.:   [id number]    DATE OF BIRTH: 03-22-1956    DATE OF PROCEDURE: 11/27/2022                                 OPERATIVE REPORT     PREOPERATIVE DIAGNOSES: 1. Bilateral lumbar radiculopathy (M54.16) 2. Spinal stenosis L1/2, L2/3, L3/4, L4/5, L5/S1 3. Lumbar degenerative scoliosis (M41.26) 4. L5/S1 spondylolisthesis (M43.16)    POSTOPERATIVE DIAGNOSES: 1. Bilateral lumbar radiculopathy (M54.16) 2. Spinal stenosis L1/2, L2/3, L3/4, L4/5, L5/S1 3. Lumbar degenerative scoliosis (M41.26) 4. L5/S1 spondylolisthesis (M43.16)    PROCEDURE (Stage 1 of 2)  Anterior lumbar interbody fusion, L4/5, L5/S1 2 .  Lateral interbody fusion, via a left-sided approach, L1/2, L2/3, L3/4 3.   Insertion of interbody device x 5 (Globus expandable spacers) 4.   Intraoperative use of fluoroscopy. 5.   Retroperitoneal approach to access L4/5 and L5/S1 (with Dr. Carlis Abbott as co-surgeon) 6.   Use of morselized allograft (DBX-mix)   SURGEON:  Phylliss Bob, MD.   ASSISTANTPricilla Holm, PA-C.   ANESTHESIA:  General endotracheal anesthesia.   COMPLICATIONS:  None.   DISPOSITION:  Stable.   ESTIMATED BLOOD LOSS:  Minimal.   INDICATIONS FOR SURGERY:  Briefly, Mr. Brett Hernandez is a pleasant 67 y.o. -year- old male who did present to me in with ongoing severe pain in his bilateral legs.  The pain was debilitating and severely limiting his function. The pain was unrelenting despite appropriate treatment measures, and we did discuss proceeding with the procedure noted above. The patient did present therefore today for stage 1 of what was to be a 2-stage procedure.  He did understand that stage 2 would involve a posterior decompression and fusion on the following day.   OPERATIVE DETAILS:  On 11/27/2022 , the patient was brought to surgery and general endotracheal anesthesia was administered.  The patient was placed in the lateral decubitus position, with the  left side up. The patient's hips and knees were flexed and he was secured to the bed using tape. His left arm was placed on an arm board.  All bony prominences were meticulously padded.  The bed was appropriately angled in order to ensure a perfect AP and lateral trajectory during surgery.  The patient's left flank was then prepped and draped in the usual sterile fashion.  A time-out procedure was performed.  At this point, a left-sided transverse incision was made between the L1/2 and L2/3 intervertebral spaces.   I then dissected bluntly through the intercostal region in order to approach L1/2.  The parietal pleura was entered.  I was superior to the diaphragm.  The L1-2 space was identified and a self-retaining retractor was placed over the lateral aspect of the disc space.  There were no neurologic structures in the immediate vicinity of the retractor.  This was confirmed using neurologic monitoring.  At this point, I performed an L1/2 annulotomy and I did release the contralateral annulus.  I then performed a thorough and complete L1/2 intervertebral diskectomy.   I then placed a series of trials.  I did ultimately elect to place a 54m implant.  The implant was liberally packed with DBX-mix and was tamped into position, and expanded to approximately 10.5 mm in height. Of note, I did note excellent restoration of the intervertebral disc height upon advancing the implant across the intervertebral space.  The self-retaining  retractor was then removed.  The wound was then irrigated.  The intercostal muscles and subcutaneous tissue was approximated using #1 Vicryl, over a red rubber catheter. I then had the CRNA perform a Valsalva maneuver, after which point the red rubber catheter was removed. This was done in order to remove as much fluid and air from the chest cavity as possible.  This wound was then closed using 4-0 Monocryl.  I then made an additional lateral incision between the L2-3, and L3-4  intervertebral disc spaces.  The external and internal oblique musculature was bluntly dissected, as was the transversalis muscle and fascia.  The retroperitoneal space was noted, and the peritoneum was bluntly swept anteriorly.  I then advanced the initial dilator through the psoas musculature, the lateral aspect of the L2-3 intervertebral space.  Once again, neurologic monitoring was liberally utilized, to ensure that there was no neurologic structures in the immediate vicinity of the dilator.  I then sequentially dilated and placed a self- retaining retractor.  Again, I did continue to use triggered EMG, and there were no neurologic structures noted in the immediate vicinity of the retractor.  I then again performed annulotomy and then performed a thorough complete L2-3 intervertebral diskectomy.  Once again, the contralateral annulus was released.  I then placed a series of trials and I did ultimately elect to place a 52m implant, after packing it liberally with DBX-mix.  The implant was expanded to approximately 12 mm in height.  I was very pleased with the press-fit of the Implant, and the excellent restoration of intervertebral disc height.    Through the same incision,  I again advanced an initial dilator through the psoas musculature, this time over the lateral aspect of the L3/4 intervertebral space.  Once again, neurologic monitoring was liberally utilized, to ensure that there was no neurologic structures in the immediate vicinity of the dilator.  I then sequentially dilated and placed a self- retaining retractor.  Again, I did continue to use triggered EMG, and there were no neurologic structures noted in the immediate vicinity of the retractor.  I then again performed annulotomy and then performed a thorough complete L3/4 intervertebral diskectomy.  Once again, the contralateral annulus was released.  I then placed a series of trials and I did ultimately elect to place a 57mimplant,  after packing it liberally with DBX-mix.  The implant was expanded to approximately 9.5 mm in height.  I was very pleased with the press-fit of the Implant, and the excellent restoration of intervertebral disc height.    I was very pleased with the final AP and lateral fluoroscopic images, and I was particularly pleased with the excellent restoration of coronal alignment and intervertebral disc height.  The wound was then copiously irrigated.  The wound was then closed in layers using #1 Vicryl, followed by 2-0 Vicryl, followed by 4- 0 Monocryl.  Benzoin and Steri-Strips were applied followed by a sterile dressing.  All instrument counts were correct at the termination of the procedure.  At this point, the patient was placed supine, and the abdomen was prepped and draped in the usual fashion.  A timeout procedure was performed.  At this point, a vertical incision was performed, and a retroperitoneal approach was performed by myself and Dr. ChMonica Martinez The exposure will be dictated on a separate operative report.  Once the L5/S1 level was identified, intraoperative fluoroscopy was brought into the field to confirm the appropriate operative level.  I then proceeded with  a thorough and complete L5/S1 intervertebral discectomy.  The discectomy was extended posteriorly, to the posterior longitudinal ligament.  The endplates were then prepared, and the appropriate sized intervertebral spacer was packed liberally with DBX mix and tamped into position.  Using lateral fluoroscopy, the implant was expanded to approximately 11 mm in height.  An excellent press-fit was noted.  There was excellent restoration of the intervertebral disc height.    I then turned my attention to the L4-5 intervertebral disc space.  As previously noted at L5-S1, I proceeded with a thorough and complete L4/5 intervertebral discectomy.  The discectomy was extended posteriorly, to the posterior longitudinal ligament.  The endplates  were then prepared, and the appropriate sized intervertebral spacer was packed liberally with DBX mix and expanded to approximately 11 mm in height. An excellent press-fit was noted.  There was excellent restoration of the intervertebral disc height.  I was very pleased with the final AP and lateral fluoroscopic images.  The wound was then irrigated and closed in layers.  The fascia was closed using #1 PDS, and the subcutaneous layer was closed using 0 Vicryl, followed by 2-0 Vicryl, followed by 4-0 Monocryl. Benzoin and Steri-Strips were applied, followed by sterile dressing.  All instrument counts were correct.  Of note, Pricilla Holm, PA-C was my assistant throughout anterior and lateral surgery, and did aid in retraction, suctioning, placement of the hardware and closure from start to finish.     Phylliss Bob, MD

## 2022-11-27 NOTE — H&P (Signed)
PREOPERATIVE H&P  Chief Complaint: Bilateral leg pain  HPI: Brett Hernandez is a 66 y.o. male who presents with ongoing pain in the bilateral legs  MRI reveals stenosis and instability spanning L1-S1  Patient has failed multiple forms of conservative care and continues to have pain (see office notes for additional details regarding the patient's full course of treatment)  Past Medical History:  Diagnosis Date   Arthritis    Bilateral hip pain    Chronic pain    cervical, lumbar   Headache    Low back pain    Scoliosis    Spinal stenosis    Past Surgical History:  Procedure Laterality Date   ANTERIOR CERVICAL DECOMP/DISCECTOMY FUSION N/A 12/06/2021   Procedure: ANTERIOR CERVICAL DECOMPRESSION AND FUSION CERVICAL FIVE- CERVICAL SIX WITH INSTRUMENTATION AND ALLOGRAFT;  Surgeon: Phylliss Bob, MD;  Location: [place];  Service: Orthopedics;  Laterality: N/A;   ANTERIOR CRUCIATE LIGAMENT REPAIR     CARPAL TUNNEL RELEASE Bilateral    INGUINAL HERNIA REPAIR Bilateral    ROTATOR CUFF REPAIR     SPINAL CORD STIMULATOR IMPLANT  2009   in neck   TONSILLECTOMY     removed as a child   VASECTOMY     Social History   Socioeconomic History   Marital status: Married    Spouse name: Patty   Number of children: 2   Years of education: 14 years   Highest education level: Not on file  Occupational History    Comment: [employer]   Occupation: Geologist, engineering at [employer] Use   Smoking status: Never   Smokeless tobacco: Never  Vaping Use   Vaping Use: Never used  Substance and Sexual Activity   Alcohol use: Not Currently   Drug use: Never   Sexual activity: Not on file  Other Topics Concern   Not on file  Social History Narrative   Lives with wife.   Right-handed.   3 cups caffeine per day.   Social Determinants of Health   Financial Resource Strain: Not on file  Food Insecurity: Not on file  Transportation Needs: Not on file  Physical Activity: Not on file   Stress: Not on file  Social Connections: Not on file   History reviewed. No pertinent family history. Allergies  Allergen Reactions   Grass Extracts [Gramineae Pollens]     HAY FEVER   Prior to Admission medications   Medication Sig Start Date End Date Taking? Authorizing Provider  gabapentin (NEURONTIN) 300 MG capsule Take 300 mg by mouth 3 (three) times daily. 10/11/21  Yes [provider]  guaiFENesin (MUCINEX) 600 MG 12 hr tablet Take 600 mg by mouth at bedtime.   Yes [provider]  oxyCODONE-acetaminophen (PERCOCET) 7.5-325 MG tablet Take 1 tablet by mouth every 6 (six) hours as needed for moderate pain. 10/10/22  Yes [provider]  oxymetazoline (AFRIN) 0.05 % nasal spray Place 1 spray into both nostrils every evening.   Yes [provider]  traZODone (DESYREL) 50 MG tablet Take 125 mg by mouth at bedtime. 10/31/21  Yes [provider]  SUMAtriptan (IMITREX) 25 MG tablet Take 1 tablet (25 mg total) by mouth every 2 (two) hours as needed for migraine. May repeat in 2 hours if headache persists or recurs. Patient not taking: Reported on 11/15/2022 11/16/21   Marcial Pacas, MD     All other systems have been reviewed and were otherwise negative with the exception of those  mentioned in the HPI and as above.  Physical Exam: Vitals:   11/27/22 0703  BP: 129/80  Pulse: (!) 54  Resp: 18  Temp: 98.4 F (36.9 C)  SpO2: 95%    Body mass index is 20.53 kg/m.  General: Alert, no acute distress Cardiovascular: No pedal edema Respiratory: No cyanosis, no use of accessory musculature Skin: No lesions in the area of chief complaint Neurologic: Sensation intact distally Psychiatric: Patient is competent for consent with normal mood and affect Lymphatic: No axillary or cervical lymphadenopathy   Assessment/Plan: Lumbar degenerative scoliosis, with spinal stenosis spanning L1-S1, with a bilateral L5 pars defect, with prominent left side  similar collapse across L2-L3 and L3-L4 Plan for Procedure(s): LEFT-SIDED LUMBAR 1- LUMBAR 2, LUMBAR 2- LUMBAR 3, LUMBAR 3- LUMBAR 4 LATERAL INTERBODY FUSION WITH INSTRUMENTATION AND ALLOGRAFT.  LUMBAR 4- LUMBAR 5, LUMBAR 5- SACRUM 1 ANTERIOR LUMBAR INTERBODY FUSION WITH INSTRUMENTATION AND ALLOGRAFT ABDOMINAL EXPOSURE   Norva Karvonen, MD 11/27/2022 7:43 AM

## 2022-11-27 NOTE — Op Note (Signed)
Date: November 27, 2022  Preoperative diagnosis: Degenerative scoliosis with chronic lower back pain  Postoperative diagnosis: Same  Procedure: Anterior spine exposure at the L4-L5 and L5-S1 disc space via anterior retroperitoneal approach for L4-L5 and L5-S1 ALIF  Surgeon: Dr. Marty Heck, MD  Co-surgeon: Dr. Phylliss Bob, MD  Assistant: Karn Pickler, PA  Indications: 67 year old male with degenerative scoliosis and chronic lower back pain.  He presents for multilevel approach and vascular surgery was asked to assist with anterior spine exposure for L4-L5 and L5-S1 ALIF.  He presents after risk benefits discussed.  Findings: The L4-L5 and L5-S1 disc space were marked over the left rectus muscle with a fluoroscopic C arm in the lateral position.  Paramedian incision was made here and I dissected down and open the anterior rectus sheath mobilized left rectus muscles to the midline entered into the retroperitoneum and mobilized peritoneum and left ureter across midline.  The middle sacral vessels were divided between clips and ties.  Fully mobilized on both sides of the disc space.  I then mobilized the left iliac artery and vein medial to get the L4-5 disc space after ligating an iliolumbar branch.  Retractors were initially placed at L5-S1 and then moved to L4-L5.  Anesthesia: General  Details: Patient was taken to the operating room after informed consent was obtained.  Placed on operative table in supine position.  After Dumonski initially started with the lateral approach I was called to the OR.  Please see his dictation.  I was called to the room after the patient was placed in the supine position.  Fluoroscopic C-arm was used in the lateral position to [place] the L4-L5 and L5-S1 disc space over the left abdominal wall.  The abdominal wall was then prepped and draped in standard sterile fashion.  I made a paramedian incision over the left rectus muscle dissected down with Bovie cautery  through the subcutaneous tissue.  The anterior rectus sheath was opened longitudinally over the rectus sheath and I raised flaps underneath the anterior rectus sheath.  The left rectus muscle was mobilized to the midline.  I entered lateral to muscle into the retroperitoneum and mobilized peritoneum and left ureter across midline with the help of hand-held Wiley retractors.  I did dissect peritoneum off of the posterior sheath above arcuate line.  I open posterior rectus sheath above arcuate line with Metzenbaum scissors.  This was to give [place] more exposure superiorly.  I then went initially to the L5-S1 disc space and continued to mobilize across the disc space with hand-held Wiley and suction while Dr. Lynann Bologna pulled the peritoneum to the middle.  The middle sacral vessels were divided between clips and one branch off the left iliac vein was ligated with a 2-0 silk tie and divided.  I then fully mobilized on both sides of the disc space until we had good working room.  I then went up one level and used a hand-held Wiley retractor to pull the left iliac artery and vein toward the midline and visualized the left iliac vein.  One iliolumbar branch was visualized and ligated between 2-0 silk ties clips and divided.  I then continued to mobilize the iliac artery and vein toward the midline to get to the L4-L5 disc space and fully mobilized this all the way to the contralateral side of the disc space.  I initially placed the retractors at L5-S1 with a 140 reverse lip on each side of the disc space and 120 retractors cranial caudal.  Spinal needle was placed in the disc space.  Confirmed at the correct level the case was turned over to Dr. Joan Flores.  I was called back in the room after the implant was completed at L5-S1.  The retractors were removed with good hemostasis.  We then moved up one level and had already fully mobilized the left iliac artery and vein lateral to the disc space to get L4-L5.  Dr. Lynann Bologna used  hand-held Wiley retractors to pull the artery and vein to the contralateral side of the disc space and I used 140 reverse lips on each side of the disc space and a 120 reverse lip cranial-caudal.  We had good anterior exposure.  Case was turned over to Dr. Lynann Bologna.  Complication: None  Condition: Stable  Marty Heck, MD [facility] Office: [place]

## 2022-11-27 NOTE — OR Nursing (Signed)
1554- Xray results read by Dr Lin Landsman he relayed no foreign body or objects retained, physician notified.

## 2022-11-27 NOTE — Anesthesia Preprocedure Evaluation (Signed)
Anesthesia Evaluation  Patient identified by MRN, date of birth, ID band Patient awake    Reviewed: Allergy & Precautions, NPO status , Patient's Chart, lab work & pertinent test results  History of Anesthesia Complications Negative for: history of anesthetic complications  Airway Mallampati: III  TM Distance: >3 FB Neck ROM: Full    Dental  (+) Dental Advisory Given, Edentulous Upper   Pulmonary neg pulmonary ROS   Pulmonary exam normal        Cardiovascular negative cardio ROS Normal cardiovascular exam     Neuro/Psych  Headaches  negative psych ROS   GI/Hepatic negative GI ROS, Neg liver ROS,,,  Endo/Other  negative endocrine ROS    Renal/GU negative Renal ROS     Musculoskeletal  (+) Arthritis ,  narcotic dependent  Abdominal   Peds  Hematology negative hematology ROS (+)   Anesthesia Other Findings Chronic pain Spinal cord stimulator  Reproductive/Obstetrics                             Anesthesia Physical Anesthesia Plan  ASA: 2  Anesthesia Plan: General   Post-op Pain Management: Tylenol PO (pre-op)*, Ketamine IV* and Dilaudid IV   Induction: Intravenous  PONV Risk Score and Plan: 3 and Treatment may vary due to age or medical condition, Ondansetron, Diphenhydramine and Propofol infusion  Airway Management Planned: Oral ETT  Additional Equipment: None  Intra-op Plan:   Post-operative Plan: Extubation in OR  Informed Consent: I have reviewed the patients History and Physical, chart, labs and discussed the procedure including the risks, benefits and alternatives for the proposed anesthesia with the patient or authorized representative who has indicated his/her understanding and acceptance.     Dental advisory given  Plan Discussed with: CRNA and Anesthesiologist  Anesthesia Plan Comments:        Anesthesia Quick Evaluation

## 2022-11-27 NOTE — H&P (Signed)
History and Physical Interval Note:  11/27/2022 8:03 AM  Brett Hernandez  has presented today for surgery, with the diagnosis of Lumbar degenerative scoliosis measuring 25, with spinal stenosis spanning L1-S1, with a bilateral L5 pars defect, with prominent left side similar collapse across L2-L3 and L3-L4.  The various methods of treatment have been discussed with the patient and family. After consideration of risks, benefits and other options for treatment, the patient has consented to  Procedure(s): LEFT-SIDED LUMBAR 1- LUMBAR 2, LUMBAR 2- LUMBAR 3, LUMBAR 3- LUMBAR 4 LATERAL INTERBODY FUSION WITH INSTRUMENTATION AND ALLOGRAFT.  LUMBAR 4- LUMBAR 5, LUMBAR 5- SACRUM 1 ANTERIOR LUMBAR INTERBODY FUSION WITH INSTRUMENTATION AND ALLOGRAFT (Left) ABDOMINAL EXPOSURE (N/A) as a surgical intervention.  The patient's history has been reviewed, patient examined, no change in status, stable for surgery.  I have reviewed the patient's chart and labs.  Questions were answered to the patient's satisfaction.    Anterior spine exposure for L4-S1 ALIF  Marty Heck     Patient name: Brett Hernandez          MRN: [id number]        DOB: 05-09-1956            Sex: male   REASON FOR CONSULT: ALIF L4-S1   HPI: Brett Hernandez is a 67 y.o. male, with chronic lower back pain who presents for evaluation of anterior spine exposure for L4-S1 ALIF.  In reviewing the notes the patient describes at least 6 months of worsening lower back pain.  His workup revealed lumbar degenerative scoliosis.  He has been evaluated by Dr. Lynann Bologna who has recommended an L4-L5 and L5-S1 anterior lumbar fusion in combination with a left-sided lateral interbody fusion from L1-L4.  In a staged fashion he would then bring him back for posterior spinal fusion and decompression.  His only previous abdominal surgery would be inguinal hernias back in the 1980s.       Past Medical History:  Diagnosis Date   Arthritis     Bilateral hip pain      Chronic pain      cervical, lumbar   Headache     Low back pain     Scoliosis     Spinal stenosis             Past Surgical History:  Procedure Laterality Date   ANTERIOR CERVICAL DECOMP/DISCECTOMY FUSION N/A 12/06/2021    Procedure: ANTERIOR CERVICAL DECOMPRESSION AND FUSION CERVICAL FIVE- CERVICAL SIX WITH INSTRUMENTATION AND ALLOGRAFT;  Surgeon: Phylliss Bob, MD;  Location: [facility];  Service: Orthopedics;  Laterality: N/A;   ANTERIOR CRUCIATE LIGAMENT REPAIR       CARPAL TUNNEL RELEASE Bilateral     INGUINAL HERNIA REPAIR Bilateral     ROTATOR CUFF REPAIR       SPINAL CORD STIMULATOR IMPLANT   2009    in neck   TONSILLECTOMY        removed as a child   VASECTOMY          History reviewed. No pertinent family history.   SOCIAL HISTORY: Social History         Socioeconomic History   Marital status: Married      Spouse name: Patty   Number of children: 2   Years of education: 14 years   Highest education level: Not on file  Occupational History      Comment: [employer]   Occupation: Geologist, engineering at [employer]  Smoking status: Never   Smokeless tobacco: Never  Vaping Use   Vaping Use: Never used  Substance and Sexual Activity   Alcohol use: Not Currently   Drug use: Never   Sexual activity: Not on file  Other Topics Concern   Not on file  Social History Narrative    Lives with wife.    Right-handed.    3 cups caffeine per day.    Social Determinants of Health    Financial Resource Strain: Not on file  Food Insecurity: Not on file  Transportation Needs: Not on file  Physical Activity: Not on file  Stress: Not on file  Social Connections: Not on file  Intimate Partner Violence: Not on file           Allergies  Allergen Reactions   Grass Extracts [Gramineae Pollens]        HAY FEVER            Current Outpatient Medications  Medication Sig Dispense Refill   gabapentin (NEURONTIN) 300 MG capsule Take 300 mg by mouth 3 (three)  times daily.       guaiFENesin (MUCINEX) 600 MG 12 hr tablet Take 600 mg by mouth at bedtime.       oxyCODONE-acetaminophen (PERCOCET) 7.5-325 MG tablet Take 1 tablet by mouth every 6 (six) hours as needed for moderate pain.       oxymetazoline (AFRIN) 0.05 % nasal spray Place 1 spray into both nostrils every evening.       traZODone (DESYREL) 50 MG tablet Take 125 mg by mouth at bedtime.       SUMAtriptan (IMITREX) 25 MG tablet Take 1 tablet (25 mg total) by mouth every 2 (two) hours as needed for migraine. May repeat in 2 hours if headache persists or recurs. (Patient not taking: Reported on 11/15/2022) 10 tablet 3    No current facility-administered medications for this visit.      REVIEW OF SYSTEMS:  '[X]'$  denotes positive finding, '[ ]'$  denotes negative finding Cardiac   Comments:  Chest pain or chest pressure:      Shortness of breath upon exertion:      Short of breath when lying flat:      Irregular heart rhythm:             Vascular      Pain in calf, thigh, or hip brought on by ambulation:      Pain in feet at night that wakes you up from your sleep:       Blood clot in your veins:      Leg swelling:              Pulmonary      Oxygen at home:      Productive cough:       Wheezing:              Neurologic      Sudden weakness in arms or legs:       Sudden numbness in arms or legs:       Sudden onset of difficulty speaking or slurred speech:      Temporary loss of vision in one eye:       Problems with dizziness:              Gastrointestinal      Blood in stool:       Vomited blood:              Genitourinary  Burning when urinating:       Blood in urine:             Psychiatric      Major depression:              Hematologic      Bleeding problems:      Problems with blood clotting too easily:             Skin      Rashes or ulcers:             Constitutional      Fever or chills:          PHYSICAL EXAM:    Vitals:    11/26/22 1400  BP: 118/72   Pulse: 69  Resp: 18  Temp: 97.8 F (36.6 C)  TempSrc: Temporal  SpO2: 94%  Weight: 164 lb (74.4 kg)  Height: '5\' 8"'$  (1.727 m)      GENERAL: The patient is a well-nourished male, in no acute distress. The vital signs are documented above. CARDIAC: There is a regular rate and rhythm.  VASCULAR:  Palpable femoral pulses bilaterally Bilateral PT pulses palpable PULMONARY: No respiratory distress. ABDOMEN: Soft and non-tender. MUSCULOSKELETAL: There are no major deformities or cyanosis. NEUROLOGIC: No focal weakness or paresthesias are detected. SKIN: There are no ulcers or rashes noted. PSYCHIATRIC: The patient has a normal affect.   DATA:    CT Lumbar Spine Reviewed 09/17/22:      Assessment/Plan:   67 year old male with chronic lower back pain and degenerative scoliosis that presents for evaluation of anterior spine exposure for L4-S1 ALIF.  I discussed after reviewing his CT scan he should be a good candidate for anterior approach.  Discussed paramedian incision over the left rectus and then mobilizing the left rectus muscle and enter into the retroperitoneum and mobilize peritoneum and left ureter across midline.  Discussed mobilizing iliac artery and vein to get the disc space exposed from the front.  Discussed risk of injury to the above structures.  Look forward to assisting Dr. Lynann Bologna.  All questions answered.     Marty Heck, MD [facility] Office: [phone]

## 2022-11-27 NOTE — Transfer of Care (Signed)
Immediate Anesthesia Transfer of Care Note  Patient: Brett Hernandez  Procedure(s) Performed: LEFT-SIDED LUMBAR 1- LUMBAR 2, LUMBAR 2- LUMBAR 3, LUMBAR 3- LUMBAR 4 LATERAL INTERBODY FUSION WITH INSTRUMENTATION AND ALLOGRAFT.  LUMBAR 4- LUMBAR 5, LUMBAR 5- SACRUM 1 ANTERIOR LUMBAR INTERBODY FUSION WITH INSTRUMENTATION AND ALLOGRAFT (Left) ABDOMINAL EXPOSURE  Patient Location: PACU  Anesthesia Type:General  Level of Consciousness: drowsy  Airway & Oxygen Therapy: Patient Spontanous Breathing and Patient connected to face mask oxygen  Post-op Assessment: Report given to RN and Post -op Vital signs reviewed and stable  Post vital signs: Reviewed and stable  Last Vitals:  Vitals Value Taken Time  BP 148/82 11/27/22 1610  Temp    Pulse 94 11/27/22 1613  Resp 21 11/27/22 1613  SpO2 98 % 11/27/22 1613  Vitals shown include unvalidated device data.  Last Pain:  Vitals:   11/27/22 0724  TempSrc:   PainSc: 6       Patients Stated Pain Goal: 2 ([id number])  Complications: No notable events documented.

## 2022-11-28 ENCOUNTER — Inpatient Hospital Stay (HOSPITAL_COMMUNITY): Payer: 59

## 2022-11-28 ENCOUNTER — Inpatient Hospital Stay (HOSPITAL_COMMUNITY): Admission: RE | Admit: 2022-11-28 | Payer: 59 | Source: Home / Self Care | Admitting: Orthopedic Surgery

## 2022-11-28 ENCOUNTER — Inpatient Hospital Stay (HOSPITAL_COMMUNITY): Payer: 59 | Admitting: Certified Registered"

## 2022-11-28 ENCOUNTER — Encounter (HOSPITAL_COMMUNITY)
Admission: RE | Disposition: A | Discharge status: Home / Self Care | Payer: Self-pay | Source: Home / Self Care | Attending: Orthopedic Surgery

## 2022-11-28 DIAGNOSIS — M4186 Other forms of scoliosis, lumbar region: Secondary | ICD-10-CM

## 2022-11-28 DIAGNOSIS — M5416 Radiculopathy, lumbar region: Secondary | ICD-10-CM

## 2022-11-28 DIAGNOSIS — M4317 Spondylolisthesis, lumbosacral region: Secondary | ICD-10-CM

## 2022-11-28 DIAGNOSIS — M4807 Spinal stenosis, lumbosacral region: Secondary | ICD-10-CM

## 2022-11-28 HISTORY — PX: POSTERIOR LUMBAR FUSION 4 LEVEL: SHX6037

## 2022-11-28 LAB — GLUCOSE, CAPILLARY: Glucose-Capillary: 88 mg/dL (ref 70–99)

## 2022-11-28 SURGERY — POSTERIOR LUMBAR FUSION 4 LEVEL
Anesthesia: General | Site: Spine Lumbar

## 2022-11-28 MED ORDER — FENTANYL CITRATE (PF) 250 MCG/5ML IJ SOLN
INTRAMUSCULAR | Status: AC
  Filled 2022-11-28: qty 5

## 2022-11-28 MED ORDER — BUPIVACAINE LIPOSOME 1.3 % IJ SUSP
INTRAMUSCULAR | Status: DC | PRN
  Administered 2022-11-28: 20 mL

## 2022-11-28 MED ORDER — ROCURONIUM BROMIDE 10 MG/ML (PF) SYRINGE
PREFILLED_SYRINGE | INTRAVENOUS | Status: AC
  Filled 2022-11-28: qty 10

## 2022-11-28 MED ORDER — PROPOFOL 10 MG/ML IV BOLUS
INTRAVENOUS | Status: AC
  Filled 2022-11-28: qty 20

## 2022-11-28 MED ORDER — FENTANYL CITRATE (PF) 100 MCG/2ML IJ SOLN
INTRAMUSCULAR | Status: AC
  Filled 2022-11-28: qty 2

## 2022-11-28 MED ORDER — FENTANYL CITRATE (PF) 250 MCG/5ML IJ SOLN
INTRAMUSCULAR | Status: DC | PRN
  Administered 2022-11-28: 50 ug via INTRAVENOUS
  Administered 2022-11-28: 100 ug via INTRAVENOUS

## 2022-11-28 MED ORDER — DEXAMETHASONE SODIUM PHOSPHATE 10 MG/ML IJ SOLN
INTRAMUSCULAR | Status: DC | PRN
  Administered 2022-11-28: 10 mg via INTRAVENOUS

## 2022-11-28 MED ORDER — LIDOCAINE 2% (20 MG/ML) 5 ML SYRINGE
INTRAMUSCULAR | Status: DC | PRN
  Administered 2022-11-28: 60 mg via INTRAVENOUS

## 2022-11-28 MED ORDER — HYDROMORPHONE HCL 1 MG/ML IJ SOLN
INTRAMUSCULAR | Status: AC
  Filled 2022-11-28: qty 0.5

## 2022-11-28 MED ORDER — LACTATED RINGERS IV SOLN: INTRAVENOUS | Status: DC | PRN

## 2022-11-28 MED ORDER — KETAMINE HCL 50 MG/5ML IJ SOSY
PREFILLED_SYRINGE | INTRAMUSCULAR | Status: AC
  Filled 2022-11-28: qty 5

## 2022-11-28 MED ORDER — BUPIVACAINE LIPOSOME 1.3 % IJ SUSP
INTRAMUSCULAR | Status: AC
  Filled 2022-11-28: qty 20

## 2022-11-28 MED ORDER — BUPIVACAINE-EPINEPHRINE (PF) 0.25% -1:200000 IJ SOLN
INTRAMUSCULAR | Status: AC
  Filled 2022-11-28: qty 30

## 2022-11-28 MED ORDER — OXYCODONE HCL 5 MG PO TABS: 5.0000 mg | ORAL_TABLET | Freq: Once | ORAL | Status: DC | PRN

## 2022-11-28 MED ORDER — MIDAZOLAM HCL 2 MG/2ML IJ SOLN
INTRAMUSCULAR | Status: DC | PRN
  Administered 2022-11-28: 2 mg via INTRAVENOUS

## 2022-11-28 MED ORDER — PROPOFOL 10 MG/ML IV BOLUS
INTRAVENOUS | Status: DC | PRN
  Administered 2022-11-28: 200 mg via INTRAVENOUS

## 2022-11-28 MED ORDER — OXYCODONE HCL 5 MG/5ML PO SOLN: 5.0000 mg | Freq: Once | ORAL | Status: DC | PRN

## 2022-11-28 MED ORDER — BUPIVACAINE-EPINEPHRINE 0.25% -1:200000 IJ SOLN
INTRAMUSCULAR | Status: DC | PRN
  Administered 2022-11-28: 30 mL

## 2022-11-28 MED ORDER — SUGAMMADEX SODIUM 200 MG/2ML IV SOLN
INTRAVENOUS | Status: DC | PRN
  Administered 2022-11-28: 200 mg via INTRAVENOUS

## 2022-11-28 MED ORDER — MIDAZOLAM HCL 2 MG/2ML IJ SOLN
INTRAMUSCULAR | Status: AC
  Filled 2022-11-28: qty 2

## 2022-11-28 MED ORDER — FENTANYL CITRATE (PF) 100 MCG/2ML IJ SOLN
25.0000 ug | INTRAMUSCULAR | Status: DC | PRN
  Administered 2022-11-28 (×2): 50 ug via INTRAVENOUS

## 2022-11-28 MED ORDER — KETAMINE HCL 10 MG/ML IJ SOLN
INTRAMUSCULAR | Status: DC | PRN
  Administered 2022-11-28: 25 mg via INTRAVENOUS

## 2022-11-28 MED ORDER — 0.9 % SODIUM CHLORIDE (POUR BTL) OPTIME
TOPICAL | Status: DC | PRN
  Administered 2022-11-28: 1000 mL

## 2022-11-28 MED ORDER — LIDOCAINE 2% (20 MG/ML) 5 ML SYRINGE
INTRAMUSCULAR | Status: AC
  Filled 2022-11-28: qty 5

## 2022-11-28 MED ORDER — ROCURONIUM BROMIDE 10 MG/ML (PF) SYRINGE
PREFILLED_SYRINGE | INTRAVENOUS | Status: DC | PRN
  Administered 2022-11-28: 50 mg via INTRAVENOUS
  Administered 2022-11-28: 30 mg via INTRAVENOUS
  Administered 2022-11-28: 50 mg via INTRAVENOUS
  Administered 2022-11-28: 30 mg via INTRAVENOUS

## 2022-11-28 MED ORDER — ONDANSETRON HCL 4 MG/2ML IJ SOLN: 4.0000 mg | Freq: Once | INTRAMUSCULAR | Status: DC | PRN

## 2022-11-28 MED ORDER — THROMBIN 20000 UNITS EX SOLR
CUTANEOUS | Status: AC
  Filled 2022-11-28: qty 20000

## 2022-11-28 MED ORDER — THROMBIN 20000 UNITS EX SOLR: CUTANEOUS | Status: DC | PRN

## 2022-11-28 MED ORDER — HYDROMORPHONE HCL 1 MG/ML IJ SOLN
INTRAMUSCULAR | Status: DC | PRN
  Administered 2022-11-28: .5 mg via INTRAVENOUS

## 2022-11-28 MED ORDER — DEXAMETHASONE SODIUM PHOSPHATE 10 MG/ML IJ SOLN
INTRAMUSCULAR | Status: AC
  Filled 2022-11-28: qty 1

## 2022-11-28 MED ORDER — ONDANSETRON HCL 4 MG/2ML IJ SOLN
INTRAMUSCULAR | Status: DC | PRN
  Administered 2022-11-28: 4 mg via INTRAVENOUS

## 2022-11-28 MED FILL — Thrombin (Recombinant) For Soln 20000 Unit: CUTANEOUS | Qty: 1 | Status: AC

## 2022-11-28 SURGICAL SUPPLY — 96 items
AGENT HMST KT MTR STRL THRMB (HEMOSTASIS)
APL SKNCLS STERI-STRIP NONHPOA (GAUZE/BANDAGES/DRESSINGS) ×1
BAG COUNTER SPONGE SURGICOUNT (BAG) ×1 IMPLANT
BAG SPNG CNTER NS LX DISP (BAG) ×1
BENZOIN TINCTURE PRP APPL 2/3 (GAUZE/BANDAGES/DRESSINGS) IMPLANT
BLADE CLIPPER SURG (BLADE) IMPLANT
BUR PRESCISION 1.7 ELITE (BURR) ×1 IMPLANT
BUR ROUND PRECISION 4.0 (BURR) IMPLANT
BUR SABER RD CUTTING 3.0 (BURR) IMPLANT
CNTNR URN SCR LID CUP LEK RST (MISCELLANEOUS) ×1 IMPLANT
CONT SPEC 4OZ STRL OR WHT (MISCELLANEOUS) ×1
CORD BIPOLAR FORCEPS 12FT (ELECTRODE) ×1 IMPLANT
COVER SURGICAL LIGHT HANDLE (MISCELLANEOUS) ×1 IMPLANT
DRAIN CHANNEL 15F RND FF W/TCR (WOUND CARE) ×1 IMPLANT
DRAPE C-ARM 42X72 X-RAY (DRAPES) ×1 IMPLANT
DRAPE C-ARMOR (DRAPES) IMPLANT
DRAPE ORTHO SPLIT 77X108 STRL (DRAPES)
DRAPE POUCH INSTRU U-SHP 10X18 (DRAPES) ×1 IMPLANT
DRAPE SURG 17X23 STRL (DRAPES) ×4 IMPLANT
DRAPE SURG ORHT 6 SPLT 77X108 (DRAPES) ×1 IMPLANT
DRSG MEPILEX POST OP 4X12 (GAUZE/BANDAGES/DRESSINGS) IMPLANT
DRSG MEPILEX POST OP 4X8 (GAUZE/BANDAGES/DRESSINGS) IMPLANT
DURAPREP 26ML APPLICATOR (WOUND CARE) ×1 IMPLANT
ELECT BLADE 4.0 EZ CLEAN MEGAD (MISCELLANEOUS) ×1
ELECT CAUTERY BLADE 6.4 (BLADE) ×2 IMPLANT
ELECT REM PT RETURN 9FT ADLT (ELECTROSURGICAL) ×1
ELECTRODE BLDE 4.0 EZ CLN MEGD (MISCELLANEOUS) ×1 IMPLANT
ELECTRODE REM PT RTRN 9FT ADLT (ELECTROSURGICAL) ×1 IMPLANT
EVACUATOR SILICONE 100CC (DRAIN) ×1 IMPLANT
GAUZE 4X4 16PLY ~~LOC~~+RFID DBL (SPONGE) ×4 IMPLANT
GAUZE SPONGE 4X4 12PLY STRL (GAUZE/BANDAGES/DRESSINGS) ×1 IMPLANT
GLOVE BIO SURGEON STRL SZ7 (GLOVE) ×1 IMPLANT
GLOVE BIO SURGEON STRL SZ8 (GLOVE) ×1 IMPLANT
GLOVE BIOGEL PI IND STRL 8 (GLOVE) ×1 IMPLANT
GLOVE SURG ENC MOIS LTX SZ6.5 (GLOVE) ×1 IMPLANT
GOWN STRL REUS W/ TWL LRG LVL3 (GOWN DISPOSABLE) ×2 IMPLANT
GOWN STRL REUS W/ TWL XL LVL3 (GOWN DISPOSABLE) ×1 IMPLANT
GOWN STRL REUS W/TWL LRG LVL3 (GOWN DISPOSABLE) ×3
GOWN STRL REUS W/TWL XL LVL3 (GOWN DISPOSABLE) ×1
GUIDEWIRE SHARP VIPER II (WIRE) IMPLANT
IV CATH 14GX2 1/4 (CATHETERS) ×1 IMPLANT
KIT ALARA NEURO ACCESS (KITS) IMPLANT
KIT BASIN OR (CUSTOM PROCEDURE TRAY) ×1 IMPLANT
KIT POSITION SURG JACKSON T1 (MISCELLANEOUS) ×1 IMPLANT
KIT TURNOVER KIT B (KITS) ×1 IMPLANT
MARKER SKIN DUAL TIP RULER LAB (MISCELLANEOUS) ×1 IMPLANT
MODULE NVM5 NEXT GEN EMG (NEEDLE) IMPLANT
NDL HYPO 25GX1X1/2 BEV (NEEDLE) ×1 IMPLANT
NDL SPNL 18GX3.5 QUINCKE PK (NEEDLE) ×2 IMPLANT
NEEDLE HYPO 25GX1X1/2 BEV (NEEDLE) ×1 IMPLANT
NEEDLE SPNL 18GX3.5 QUINCKE PK (NEEDLE) ×2 IMPLANT
NS IRRIG 1000ML POUR BTL (IV SOLUTION) ×1 IMPLANT
PACK LAMINECTOMY ORTHO (CUSTOM PROCEDURE TRAY) ×1 IMPLANT
PACK UNIVERSAL I (CUSTOM PROCEDURE TRAY) ×1 IMPLANT
PAD ARMBOARD 7.5X6 YLW CONV (MISCELLANEOUS) ×2 IMPLANT
PATTIES SURGICAL .5 X1 (DISPOSABLE) ×1 IMPLANT
PATTIES SURGICAL .5 X3 (DISPOSABLE) ×1 IMPLANT
PATTIES SURGICAL .5X1.5 (GAUZE/BANDAGES/DRESSINGS) ×1 IMPLANT
PATTIES SURGICAL .75X.75 (GAUZE/BANDAGES/DRESSINGS) ×1 IMPLANT
PUTTY BONE DBX 2.5 MIS (Bone Implant) IMPLANT
ROD LORDOSED VIPER 150MM LUMB (Rod) IMPLANT
ROD VIPER 130MM (Rod) IMPLANT
ROD VIPER2 5.5 200 LORDOTIC (Rod) IMPLANT
SCREW CANN VIPER 5X50 (Screw) IMPLANT
SCREW POLY X-TAB VIPER 7.0X50 (Screw) IMPLANT
SCREW SET SINGLE INNER MIS (Screw) IMPLANT
SCREW VIPER 2 XTAB POLY 5X45 (Screw) IMPLANT
SCREW XTAB POLY VIPER  6X45 (Screw) ×1 IMPLANT
SCREW XTAB POLY VIPER  7X45 (Screw) ×5 IMPLANT
SCREW XTAB POLY VIPER 6X45 (Screw) IMPLANT
SCREW XTAB POLY VIPER 7X45 (Screw) IMPLANT
SPONGE INTESTINAL PEANUT (DISPOSABLE) ×1 IMPLANT
SPONGE SURGIFOAM ABS GEL 100 (HEMOSTASIS) IMPLANT
STRIP CLOSURE SKIN 1/2X4 (GAUZE/BANDAGES/DRESSINGS) IMPLANT
STRIP CLOSURE SKIN 1/4X4 (GAUZE/BANDAGES/DRESSINGS) IMPLANT
SURGIFLO W/THROMBIN 8M KIT (HEMOSTASIS) IMPLANT
SUT MNCRL AB 4-0 PS2 18 (SUTURE) ×2 IMPLANT
SUT PROLENE 6 0 C 1 24 (SUTURE) IMPLANT
SUT VIC AB 0 CT1 18XCR BRD 8 (SUTURE) ×2 IMPLANT
SUT VIC AB 0 CT1 8-18 (SUTURE) ×1
SUT VIC AB 1 CT1 18XCR BRD 8 (SUTURE) ×2 IMPLANT
SUT VIC AB 1 CT1 8-18 (SUTURE) ×1
SUT VIC AB 2-0 CT2 18 VCP726D (SUTURE) ×2 IMPLANT
SYR 20ML LL LF (SYRINGE) ×1 IMPLANT
SYR BULB IRRIG 60ML STRL (SYRINGE) ×1 IMPLANT
SYR CONTROL 10ML LL (SYRINGE) ×1 IMPLANT
SYR TB 1ML LUER SLIP (SYRINGE) ×1 IMPLANT
TAP CANN VIPER2 DL 5.0 (TAP) IMPLANT
TAP CANN VIPER2 DL 6.0 (TAP) IMPLANT
TAP VIPER MIS 4.35MM (TAP) IMPLANT
TAPE CLOTH SOFT 2X10 (GAUZE/BANDAGES/DRESSINGS) IMPLANT
TOWEL GREEN STERILE (TOWEL DISPOSABLE) ×1 IMPLANT
TOWEL GREEN STERILE FF (TOWEL DISPOSABLE) ×1 IMPLANT
TRAY FOLEY MTR SLVR 16FR STAT (SET/KITS/TRAYS/PACK) ×1 IMPLANT
WATER STERILE IRR 1000ML POUR (IV SOLUTION) ×1 IMPLANT
YANKAUER SUCT BULB TIP NO VENT (SUCTIONS) ×1 IMPLANT

## 2022-11-28 NOTE — Anesthesia Postprocedure Evaluation (Signed)
Anesthesia Post Note  Patient: Brett Hernandez  Procedure(s) Performed: LUMBAR 1- LUMBAR 2, LUMBAR 2- LUMBAR 3, LUMBAR 3- LUMBAR 4, LUMBAR 4- LUMBAR 5, LUMBAR 5- SACRUM 1 POSTERIOR DECOMPRESSION AND FUSION WITH INSTRUMENTATION AND ALLOGRAFT (Spine Lumbar)     Patient location during evaluation: PACU Anesthesia Type: General Level of consciousness: awake and alert Pain management: pain level controlled Vital Signs Assessment: post-procedure vital signs reviewed and stable Respiratory status: spontaneous breathing, nonlabored ventilation, respiratory function stable and patient connected to nasal cannula oxygen Cardiovascular status: blood pressure returned to baseline and stable Postop Assessment: no apparent nausea or vomiting Anesthetic complications: no   No notable events documented.  Last Vitals:  Vitals:   11/28/22 1230 11/28/22 1259  BP: (!) 143/73 (!) 138/58  Pulse: 65 65  Resp: (!) 9 18  Temp:  36.9 C  SpO2: 94% 96%    Last Pain:  Vitals:   11/28/22 1259  TempSrc: Oral  PainSc:                  Audry Pili

## 2022-11-28 NOTE — H&P (Signed)
Patient tolerated stage one of his procedure yesterday without issue.  We will proceed today with stage two, a posterior spinal fusion and decompression, spanning L1-S1.

## 2022-11-28 NOTE — Evaluation (Signed)
Physical Therapy Evaluation Patient Details Name: Brett Hernandez MRN: [id number] DOB: [birth year] Today's Date: 11/28/2022  History of Present Illness  Pt is a 67 y.o. male who presented 11/27/22 anterior spine exposure for L4-S1 ALIF, followed by PLIF L1-S1 on 3/14. PMH: arthritis, scoliosis, spinal stenosis   Clinical Impression  Pt presents with condition above and deficits mentioned below, see PT Problem List. PTA, he was independent without AD, working as a Therapist, music, driving, and living with his wife and daughter in a 1-level house with a level entry. Currently, pt is primarily limited by back and incisional pain s/p surgeries mentioned above. He reports his numbness in his feet has improved greatly since surgery compared to prior to surgery, to the point he is questioning whether there is any residual numbness or not. He displays good strength in his bil lower extremities. He was able to perform all functional mobility without LOB at a min guard assist level today. As pt appears to be primarily limited by his pain, pt is anticipated to progress well as his pain improves. Thus, do not anticipate a need for follow-up PT upon d/c. Will continue to follow acutely.     Recommendations for follow up therapy are one component of a multi-disciplinary discharge planning process, led by the attending physician.  Recommendations may be updated based on patient status, additional functional criteria and insurance authorization.  Follow Up Recommendations No PT follow up      Assistance Recommended at Discharge PRN  Patient can return home with the following  A little help with bathing/dressing/bathroom;Assistance with cooking/housework;Assist for transportation    Equipment Recommendations Rolling walker (2 wheels);BSC/3in1  Recommendations for Other Services       Functional Status Assessment Patient has had a recent decline in their functional status and demonstrates the ability to  make significant improvements in function in a reasonable and predictable amount of time.     Precautions / Restrictions Precautions Precautions: Fall;Back Precaution Booklet Issued: Yes (comment) Precaution Comments: reviewed precautions Required Braces or Orthoses: Spinal Brace Spinal Brace: Thoracolumbosacral orthotic;Applied in sitting position Restrictions Weight Bearing Restrictions: No      Mobility  Bed Mobility Overal bed mobility: Needs Assistance Bed Mobility: Sit to Sidelying, Rolling Rolling: Min guard       Sit to sidelying: Min guard General bed mobility comments: Cues to prop onto L elbow to ascend trunk while lifting legs onto bed to return to sidelying and roll back to supine via log roll. Extra time due to pain, min guard for safety    Transfers Overall transfer level: Needs assistance Equipment used: Rolling walker (2 wheels) Transfers: Sit to/from Stand Sit to Stand: Min guard           General transfer comment: Min guard assist for safety coming to stand from EOB    Ambulation/Gait Ambulation/Gait assistance: Min guard Gait Distance (Feet): 80 Feet Assistive device: Rolling walker (2 wheels) Gait Pattern/deviations: Step-through pattern, Decreased stride length Gait velocity: reduced Gait velocity interpretation: <1.31 ft/sec, indicative of household ambulator   General Gait Details: Pt ambulates at slowly but fairly steadily using a RW, demonstrating good bil feet clearance but decreased stride length. No overt LOB, min guard for safety  Stairs            Wheelchair Mobility    Modified Rankin (Stroke Patients Only)       Balance Overall balance assessment: Needs assistance Sitting-balance support: No upper extremity supported, Feet supported Sitting balance-Leahy Scale: Good Sitting  balance - Comments: Able to donn brace sitting EOB with verbal cues and supervision, reaching off BOS to do so without LOB   Standing balance  support: Bilateral upper extremity supported, No upper extremity supported, During functional activity Standing balance-Leahy Scale: Fair Standing balance comment: Able to stand statically without UE support, benefits from RW to ambulate                             Pertinent Vitals/Pain Pain Assessment Pain Assessment: Faces Faces Pain Scale: Hurts even more Pain Location: Incisional pain at L lateral trunk, back, and abdomen Pain Descriptors / Indicators: Operative site guarding, Discomfort, Grimacing, Guarding, Burning, Sharp Pain Intervention(s): Limited activity within patient's tolerance, Monitored during session, Repositioned    Home Living Family/patient expects to be discharged to:: Private residence Living Arrangements: Spouse/significant other;Children (daughter) Available Help at Discharge: Family;Available 24 hours/day Type of Home: House Home Access: Level entry       Home Layout: One level Home Equipment: None      Prior Function Prior Level of Function : Independent/Modified Independent;Driving;Working/employed             Mobility Comments: No AD ADLs Comments: Works as Forensic psychologist        Extremity/Trunk Assessment   Upper Extremity Assessment Upper Extremity Assessment: Defer to OT evaluation    Lower Extremity Assessment Lower Extremity Assessment: RLE deficits/detail;LLE deficits/detail RLE Deficits / Details: MMT scores of 4+ to 5 grossly bil; reported some numbness in his feet prior to surgery and reporting that has improved now since surgery with pt questioning some mild numbness still being present (R better than L) RLE Sensation: decreased light touch LLE Deficits / Details: MMT scores of 4+ to 5 grossly bil; reported some numbness in his feet prior to surgery and reporting that has improved now since surgery with pt questioning some mild numbness still being present (R better than L) LLE Sensation:  decreased light touch    Cervical / Trunk Assessment Cervical / Trunk Assessment: Back Surgery  Communication   Communication: No difficulties  Cognition Arousal/Alertness: Awake/alert Behavior During Therapy: WFL for tasks assessed/performed Overall Cognitive Status: Within Functional Limits for tasks assessed                                 General Comments: Pt reporting feeling a little out of it following surgery, but follows commands appropriately, some delayed processing at times, likely post anesthesia affects though        General Comments      Exercises     Assessment/Plan    PT Assessment Patient needs continued PT services  PT Problem List Decreased activity tolerance;Decreased balance;Decreased mobility;Decreased knowledge of precautions;Impaired sensation;Pain       PT Treatment Interventions DME instruction;Gait training;Stair training;Therapeutic activities;Functional mobility training;Therapeutic exercise;Balance training;Neuromuscular re-education;Patient/family education    PT Goals (Current goals can be found in the Care Plan section)  Acute Rehab PT Goals Patient Stated Goal: to improve PT Goal Formulation: With patient Time For Goal Achievement: 12/12/22 Potential to Achieve Goals: Good    Frequency Min 5X/week     Co-evaluation               AM-PAC PT "6 Clicks" Mobility  Outcome Measure Help needed turning from your back to your side while in a flat bed without using bedrails?:  A Little Help needed moving from lying on your back to sitting on the side of a flat bed without using bedrails?: A Little Help needed moving to and from a bed to a chair (including a wheelchair)?: A Little Help needed standing up from a chair using your arms (e.g., wheelchair or bedside chair)?: A Little Help needed to walk in hospital room?: A Little Help needed climbing 3-5 steps with a railing? : A Little 6 Click Score: 18    End of Session  Equipment Utilized During Treatment: Gait belt;Back brace Activity Tolerance: Patient tolerated treatment well Patient left: in bed;with call bell/phone within reach;with bed alarm set Nurse Communication: Mobility status PT Visit Diagnosis: Unsteadiness on feet (R26.81);Other abnormalities of gait and mobility (R26.89);Difficulty in walking, not elsewhere classified (R26.2);Pain Pain - part of body:  (back)    Time: [id number] PT Time Calculation (min) (ACUTE ONLY): 26 min   Charges:   PT Evaluation $PT Eval Low Complexity: 1 Low PT Treatments $Therapeutic Activity: 8-22 mins        Moishe Spice, PT, DPT Acute Rehabilitation Services  Office: [phone]   Orvan Falconer 11/28/2022, 5:08 PM

## 2022-11-28 NOTE — Progress Notes (Signed)
OT Cancellation Note  Patient Details Name: Brett Hernandez MRN: [id number] DOB: July 21, 1956   Cancelled Treatment:    Reason Eval/Treat Not Completed: Patient at procedure or test/ unavailable (Pt currently off unit in OR for lumbar decompression. Will hold therapy evaluation today and follow up with patient tomorrow.)  Ailene Ravel, OTR/L,CBIS  Supplemental OT - [facility] and [facility] Secure Chat Preferred   11/28/2022, 8:23 AM

## 2022-11-28 NOTE — Progress Notes (Signed)
[facility]  Subjective  -seen in PACU this morning.  No significant abdominal pain.   Objective (!) 138/58 65 98.4 F (36.9 C) (Oral) 18 96%  Intake/Output Summary (Last 24 hours) at 11/28/2022 1402 Last data filed at 11/28/2022 1114 Gross per 24 hour  Intake 4406 ml  Output 3600 ml  Net 806 ml    Abdomen soft with appropriate postop incisional tenderness. Left paramedian incision clean dry and intact Left DP and PT palpable  Laboratory Lab Results: No results for input(s): "WBC", "HGB", "HCT", "PLT" in the last 72 hours. BMET No results for input(s): "NA", "K", "CL", "CO2", "GLUCOSE", "BUN", "CREATININE", "CALCIUM" in the last 72 hours.  COAG No results found for: "INR", "PROTIME" No results found for: "PTT"  Assessment/Planning:  67 year old male postop day 1 status post anterior spine exposure for L4-L5 and L5-S1 ALIF.  Seen in recovery after getting posterior instrumentation today.  Abdomen is soft with appropriate postop incisional tenderness.  Left pedal pulses palpable.  Looks good.  Marty Heck 11/28/2022 2:02 PM --

## 2022-11-28 NOTE — Transfer of Care (Signed)
Immediate Anesthesia Transfer of Care Note  Patient: Brett Hernandez  Procedure(s) Performed: LUMBAR 1- LUMBAR 2, LUMBAR 2- LUMBAR 3, LUMBAR 3- LUMBAR 4, LUMBAR 4- LUMBAR 5, LUMBAR 5- SACRUM 1 POSTERIOR DECOMPRESSION AND FUSION WITH INSTRUMENTATION AND ALLOGRAFT (Spine Lumbar)  Patient Location: PACU  Anesthesia Type:General  Level of Consciousness: drowsy and patient cooperative  Airway & Oxygen Therapy: Patient Spontanous Breathing and Patient connected to nasal cannula oxygen  Post-op Assessment: Report given to RN and Post -op Vital signs reviewed and stable  Post vital signs: Reviewed and stable  Last Vitals:  Vitals Value Taken Time  BP 148/73 11/28/22 1116  Temp    Pulse 72 11/28/22 1120  Resp 16 11/28/22 1120  SpO2 96 % 11/28/22 1120  Vitals shown include unvalidated device data.  Last Pain:  Vitals:   11/28/22 0550  TempSrc:   PainSc: 10-Worst pain ever      Patients Stated Pain Goal: 3 ([id number])  Complications: No notable events documented.

## 2022-11-28 NOTE — Anesthesia Procedure Notes (Signed)
Procedure Name: Intubation Date/Time: 11/28/2022 7:44 AM  Performed by: Mosetta Pigeon, CRNAPre-anesthesia Checklist: Patient identified, Emergency Drugs available, Suction available and Patient being monitored Patient Re-evaluated:Patient Re-evaluated prior to induction Oxygen Delivery Method: Circle System Utilized Preoxygenation: Pre-oxygenation with 100% oxygen Induction Type: IV induction Ventilation: Mask ventilation without difficulty Laryngoscope Size: Mac and 4 Grade View: Grade I Tube type: Oral Tube size: 7.5 mm Number of attempts: 1 Airway Equipment and Method: Stylet Placement Confirmation: ETT inserted through vocal cords under direct vision, positive ETCO2 and breath sounds checked- equal and bilateral Secured at: 22 cm Tube secured with: Tape Dental Injury: Teeth and Oropharynx as per pre-operative assessment

## 2022-11-28 NOTE — Op Note (Signed)
PATIENT NAME: Brett Hernandez [facility] RECORD NO.:   [id number]    DATE OF BIRTH: May 21, 1956   DATE OF PROCEDURE: 11/28/2022                                OPERATIVE REPORT     PREOPERATIVE DIAGNOSES: 1. Bilateral lumbar radiculopathy (M54.16) 2. Spinal stenosis L1/2, L2/3, L3/4, L4/5, L5/S1 3. Lumbar degenerative scoliosis (M41.26) 4. L5/S1 spondylolisthesis (M43.16) 5. One day status post anterior/lateral fusion procedures at L1/2, L2/3, L3/4, L4/5, L5/S1     POSTOPERATIVE DIAGNOSES: 1. Bilateral lumbar radiculopathy (M54.16) 2. Spinal stenosis L1/2, L2/3, L3/4, L4/5, L5/S1 3. Lumbar degenerative scoliosis (M41.26) 4. L5/S1 spondylolisthesis (M43.16) 5. One day status post anterior/lateral fusion procedures at L1/2, L2/3, L3/4, L4/5, L5/S1     PROCEDURE (Stage 2): 1. Posterior spinal fusion, L1/2 L2/3, L3/4, L4-5, L5/S1 2. Posterior decompression L2/3, L3/4, L4/5 and L5/S1 3. Placement of posterior segmental instrumentation, L1, L2. L3, L4 L5, and S1 bilaterally. 4. Use of morselized allograft - DBX-mix. 5. Intraoperative use of floroscopy   SURGEON:  Phylliss Bob, MD   ASSISTANT:  Pricilla Holm, PA-C   ANESTHESIA:  General endotracheal anesthesia.   COMPLICATIONS:  None.   DISPOSITION:  Stable.   ESTIMATED BLOOD LOSS:  Minimal   INDICATIONS FOR SURGERY: Briefly, Brett Hernandez is one day status post an anterior and lateral lumbar fusion as noted above.  Please refer to my operative report dated 11/27/2022, for a full account of the patient's preoperative history and indications for surgery.  The patient did present today for stage 2 of what was to be a 2-staged procedure.   OPERATIVE DETAILS:  On 11/28/2022, the patient was brought to surgery and general endotracheal anesthesia was administered.  The patient was placed prone onto a Jackson spinal bed.  The back was then prepped and draped in the usual sterile fashion.  I then made paramedian incisions on the right and  left sides, just lateral to the lateral borders of the pedicles at L1-2, L2-3, L3-4, L4-5, and L5/S1.  On the left side, the posterolateral gutter and posterior elements at the L1-2, L2-3, L3-4, L4-5, and L5/S1 levels were identified and exposed and decorticated.  DBX-mix was packed into the posterolateral gutters on the left side to aid in the success of the posterior fusion.  Also, on the patient's left side, I did use a high-speed bur to perform a thorough lateral recess decompression at L2-3, L3-4, L4-5, and L5/S1 performing a left-sided partial facetectomy and foraminotomy.  This did decompress the lateral recess and foramina on the left side at these levels. I then tapped the L1, L2, L3, L4, L5, and S1 pedicles.  Of note, the right L1 pedicle was dysplastic and extremely small, and I did elect to not proceed with instrumentation at this level.  The same was true on the left at L3.  The pedicle was extremely small, and I did not feel that attempts at cannulating those pedicles would be safe.  5 mm screws were placed at L1 and L2, a 6 mm was placed at L3 on the right, and 7 mm screws were placed bilaterally at L4, L5, and S1. Rods were then secured into the tulip heads of the screws bilaterally.  Caps were then placed and a final locking procedure was performed.  I was very pleased with the final AP and lateral fluoroscopic images.  The wound was then  copiously irrigated.  On the right and left sides, the fascia was closed using #1 Vicryl.  The subcutaneous layer was closed using 0 Vicryl followed by 2-0 Vicryl, and the skin was then closed using 4-0 Monocryl. Benzoin and Steri-Strips were applied followed by sterile dressing.  All instrument counts were correct at the termination of the procedure.    Of note, Pricilla Holm was my assistant throughout surgery, and did aid in retraction, suctioning, placement of the hardware, the decompression, and closure for the entire procedure.     Phylliss Bob, MD

## 2022-11-29 ENCOUNTER — Other Ambulatory Visit (HOSPITAL_COMMUNITY): Payer: Self-pay

## 2022-11-29 MED ORDER — OXYCODONE HCL ER 10 MG PO T12A: 10.0000 mg | EXTENDED_RELEASE_TABLET | Freq: Two times a day (BID) | ORAL | 0 refills | Status: DC

## 2022-11-29 MED ORDER — METHOCARBAMOL 500 MG PO TABS: 500.0000 mg | ORAL_TABLET | Freq: Four times a day (QID) | ORAL | 0 refills | Status: DC | PRN

## 2022-11-29 MED ORDER — OXYCODONE HCL ER 10 MG PO T12A
10.0000 mg | EXTENDED_RELEASE_TABLET | Freq: Two times a day (BID) | ORAL | 0 refills | Status: AC
  Filled 2022-11-29: qty 28, 14d supply, fill #0

## 2022-11-29 MED ORDER — OXYCODONE-ACETAMINOPHEN 7.5-325 MG PO TABS: 1.0000 | ORAL_TABLET | ORAL | 0 refills | Status: DC | PRN

## 2022-11-29 MED ORDER — ONDANSETRON HCL 4 MG PO TABS: 4.0000 mg | ORAL_TABLET | Freq: Four times a day (QID) | ORAL | 0 refills | Status: DC | PRN

## 2022-11-29 NOTE — Progress Notes (Signed)
    Patient doing well PO day 1 and 2 after staged LAT/ANT/POST lumbar fusion. Pt having expected PO LBP today after posterior portion yesterday. Eating and drinking well, normal B/B function, back pain moderately well controlled, leg pain resolved  Physical Exam: Vitals:   11/29/22 0333 11/29/22 0730  BP: 119/73 (!) 144/76  Pulse: 76 75  Resp: 18 16  Temp: 99.7 F (37.6 C) (!) 100.4 F (38 C)  SpO2: 91% 94%    Dressings in place, CDI, pt laying down in  bed comfortably, TLSO at bedside NVI  POD #1 and 2 after staged LAT/ANT/POST lumbar fusion  - up with PT/OT, encourage ambulation - Percocet for pain, Robaxin for muscle spasms - likely d/c home today with f/u in 2 weeks

## 2022-11-29 NOTE — Progress Notes (Signed)
Physical Therapy Treatment Patient Details Name: Brett Hernandez MRN: [id number] DOB: 07/28/1956 Today's Date: 11/29/2022   History of Present Illness Pt is a 67 y.o. male who presented 11/27/22 anterior spine exposure for L4-S1 ALIF, followed by PLIF L1-S1 on 3/14. PMH: arthritis, scoliosis, spinal stenosis    PT Comments    Patient progressing with mobility and able to demonstrate safe technique with precautions more S level for bed mobility except to get feet back up on bed.  Has level entry home so steps not addressed.  Also plans to sleep in recliner initially though reports how he has modified so can be safe for him and even lay flat if needed.  Discussed importance of gradual progression of activities and planning ahead since takes him longer to move right now.  Planned d/c today and remains appropriate for no initial follow up though did discuss if needs help for improving core strength once healed from surgery to discuss with MD for outpatient referral.    Recommendations for follow up therapy are one component of a multi-disciplinary discharge planning process, led by the attending physician.  Recommendations may be updated based on patient status, additional functional criteria and insurance authorization.  Follow Up Recommendations  No PT follow up     Assistance Recommended at Discharge Intermittent Supervision/Assistance  Patient can return home with the following A little help with bathing/dressing/bathroom;Assistance with cooking/housework;Assist for transportation   Equipment Recommendations  Rolling walker (2 wheels);BSC/3in1    Recommendations for Other Services       Precautions / Restrictions Precautions Precautions: Fall;Back Precaution Booklet Issued: Yes (comment) Precaution Comments: reviewed precautions Required Braces or Orthoses: Spinal Brace Spinal Brace: Thoracolumbosacral orthotic;Applied in sitting position Restrictions Weight Bearing Restrictions: No      Mobility  Bed Mobility Overal bed mobility: Needs Assistance Bed Mobility: Sit to Sidelying, Sidelying to Sit Rolling: Supervision Sidelying to sit: Supervision     Sit to sidelying: Min assist General bed mobility comments: using good technique and using rail appropriately, assist for legs back into bed to supine, plans to sleep in recliner initially    Transfers Overall transfer level: Needs assistance Equipment used: Rolling walker (2 wheels) Transfers: Sit to/from Stand Sit to Stand: Supervision           General transfer comment: increased time, heavy UE reliance and LE's buckling initially    Ambulation/Gait Ambulation/Gait assistance: Supervision Gait Distance (Feet): 90 Feet Assistive device: Rolling walker (2 wheels) Gait Pattern/deviations: Step-through pattern, Step-to pattern, Decreased stride length       General Gait Details: slow with UE reliance due to hip pain stopping walker for increased weight on walker throughout   Stairs             Wheelchair Mobility    Modified Rankin (Stroke Patients Only)       Balance Overall balance assessment: Needs assistance   Sitting balance-Leahy Scale: Fair Sitting balance - Comments: guarding with UE's at times due to pain, donned brace with set up   Standing balance support: Bilateral upper extremity supported, No upper extremity supported, During functional activity Standing balance-Leahy Scale: Poor Standing balance comment: UE support in standing needed with initial knee buckling                            Cognition Arousal/Alertness: Awake/alert Behavior During Therapy: WFL for tasks assessed/performed Overall Cognitive Status: Within Functional Limits for tasks assessed  Exercises      General Comments General comments (skin integrity, edema, etc.): reviewed precautions and activity progression as well as frequent  rest breaks and timing walks around pain meds at home      Pertinent Vitals/Pain Pain Assessment Pain Assessment: Faces Faces Pain Scale: Hurts whole lot Pain Location: Incisional pain at L lateral trunk, back, and abdomen Pain Descriptors / Indicators: Operative site guarding, Discomfort, Grimacing, Guarding, Burning, Sharp Pain Intervention(s): Monitored during session, Repositioned    Home Living Family/patient expects to be discharged to:: Private residence Living Arrangements: Spouse/significant other;Children (daughter) Available Help at Discharge: Family;Available 24 hours/day Type of Home: House Home Access: Level entry       Home Layout: One level Home Equipment: None      Prior Function            PT Goals (current goals can now be found in the care plan section) Progress towards PT goals: Progressing toward goals    Frequency           PT Plan Current plan remains appropriate    Co-evaluation              AM-PAC PT "6 Clicks" Mobility   Outcome Measure  Help needed turning from your back to your side while in a flat bed without using bedrails?: A Little Help needed moving from lying on your back to sitting on the side of a flat bed without using bedrails?: A Little Help needed moving to and from a bed to a chair (including a wheelchair)?: None Help needed standing up from a chair using your arms (e.g., wheelchair or bedside chair)?: None Help needed to walk in hospital room?: None Help needed climbing 3-5 steps with a railing? : Total 6 Click Score: 19    End of Session Equipment Utilized During Treatment: Back brace Activity Tolerance: Patient tolerated treatment well Patient left: in bed;with call bell/phone within reach   PT Visit Diagnosis: Unsteadiness on feet (R26.81);Other abnormalities of gait and mobility (R26.89);Difficulty in walking, not elsewhere classified (R26.2);Pain Pain - part of body:  (back)     Time: [id number] PT  Time Calculation (min) (ACUTE ONLY): 26 min  Charges:  $Gait Training: 8-22 mins $Therapeutic Activity: 8-22 mins                     Magda Kiel, PT Acute Rehabilitation Services Office:[phone] 11/29/2022    Brett Hernandez 11/29/2022, 11:47 AM

## 2022-11-29 NOTE — Evaluation (Signed)
Occupational Therapy Evaluation Patient Details Name: Brett Hernandez MRN: [id number] DOB: 24-Feb-1956 Today's Date: 11/29/2022   History of Present Illness Pt is a 67 y.o. male who presented 11/27/22 anterior spine exposure for L4-S1 ALIF, followed by PLIF L1-S1 on 3/14. PMH: arthritis, scoliosis, spinal stenosis   Clinical Impression   Prior to this admission, patient working full time, and independent in ADLs and driving. Currently, patient presenting with significant back pain, and needing increased assist to complete all lower body ADLs. Patient requiring mod A in order to complete lower body dressing, due to being unable complete figure four technique. Patient min guard for bed mobility and transfers, and able to maintain all precautions throughout. Patient will have family support at home for ADL management therefore no need for additional OT follow up. OT recommending BSC and RW for home use; OT will sign off at this time.      Recommendations for follow up therapy are one component of a multi-disciplinary discharge planning process, led by the attending physician.  Recommendations may be updated based on patient status, additional functional criteria and insurance authorization.   Follow Up Recommendations  No OT follow up     Assistance Recommended at Discharge Intermittent Supervision/Assistance  Patient can return home with the following A little help with walking and/or transfers;A lot of help with bathing/dressing/bathroom;Assist for transportation;Assistance with cooking/housework    Functional Status Assessment  Patient has had a recent decline in their functional status and demonstrates the ability to make significant improvements in function in a reasonable and predictable amount of time.  Equipment Recommendations  BSC/3in1;Other (comment) (RW)    Recommendations for Other Services       Precautions / Restrictions Precautions Precautions: Fall;Back Precaution Booklet  Issued: Yes (comment) Precaution Comments: reviewed precautions Required Braces or Orthoses: Spinal Brace Spinal Brace: Thoracolumbosacral orthotic;Applied in sitting position Restrictions Weight Bearing Restrictions: No      Mobility Bed Mobility Overal bed mobility: Needs Assistance Bed Mobility: Sit to Sidelying, Rolling Rolling: Min guard       Sit to sidelying: Min guard General bed mobility comments: Extra time needed, but excellent execution of log roll technique    Transfers Overall transfer level: Needs assistance Equipment used: Rolling walker (2 wheels) Transfers: Sit to/from Stand Sit to Stand: Min guard           General transfer comment: Min guard assist for safety coming to stand from EOB      Balance Overall balance assessment: Needs assistance Sitting-balance support: No upper extremity supported, Feet supported Sitting balance-Leahy Scale: Good Sitting balance - Comments: Able to donn brace sitting EOB with verbal cues and supervision, reaching off BOS to do so without LOB   Standing balance support: Bilateral upper extremity supported, No upper extremity supported, During functional activity Standing balance-Leahy Scale: Fair Standing balance comment: Able to stand statically without UE support, benefits from RW to ambulate                           ADL either performed or assessed with clinical judgement   ADL Overall ADL's : Needs assistance/impaired Eating/Feeding: Set up;Sitting   Grooming: Set up;Standing   Upper Body Bathing: Min guard;Sitting   Lower Body Bathing: Moderate assistance;Sitting/lateral leans;Sit to/from stand Lower Body Bathing Details (indicate cue type and reason): unable to complete figure 4 therefore increased assist needed to complete Upper Body Dressing : Min guard;Sitting;Standing   Lower Body Dressing: Moderate assistance;Cueing for  back precautions Lower Body Dressing Details (indicate cue type and  reason): unable to complete figure 4 therefore increased assist needed to complete Toilet Transfer: Min guard;Ambulation;Rolling walker (2 wheels)           Functional mobility during ADLs: Minimal assistance;Cueing for sequencing;Cueing for safety;Rolling walker (2 wheels) General ADL Comments: Patient presenting with significant back pain, and needing increased assist to complete all lower body ADLs.     Vision Baseline Vision/History: 1 Wears glasses Ability to See in Adequate Light: 0 Adequate Patient Visual Report: No change from baseline Vision Assessment?: No apparent visual deficits     Perception     Praxis      Pertinent Vitals/Pain Pain Assessment Pain Assessment: Faces Faces Pain Scale: Hurts whole lot Pain Location: Incisional pain at L lateral trunk, back, and abdomen Pain Descriptors / Indicators: Operative site guarding, Discomfort, Grimacing, Guarding, Burning, Sharp Pain Intervention(s): Limited activity within patient's tolerance, Monitored during session, Repositioned     Hand Dominance     Extremity/Trunk Assessment Upper Extremity Assessment Upper Extremity Assessment: Generalized weakness;Overall [facility] for tasks assessed   Lower Extremity Assessment Lower Extremity Assessment: Defer to PT evaluation   Cervical / Trunk Assessment Cervical / Trunk Assessment: Back Surgery   Communication Communication Communication: No difficulties   Cognition Arousal/Alertness: Awake/alert Behavior During Therapy: WFL for tasks assessed/performed Overall Cognitive Status: Within Functional Limits for tasks assessed                                 General Comments: Patient alert and appropriate throughout     General Comments       Exercises     Shoulder Instructions      Home Living Family/patient expects to be discharged to:: Private residence Living Arrangements: Spouse/significant other;Children (daughter) Available Help at Discharge:  Family;Available 24 hours/day Type of Home: House Home Access: Level entry     Home Layout: One level     Bathroom Shower/Tub: Occupational psychologist: Standard Bathroom Accessibility: Yes   Home Equipment: None          Prior Functioning/Environment Prior Level of Function : Independent/Modified Independent;Driving;Working/employed             Mobility Comments: No AD ADLs Comments: Works as Youth worker Problem List: Decreased strength;Decreased range of motion;Decreased activity tolerance;Impaired balance (sitting and/or standing);Decreased coordination;Decreased knowledge of use of DME or AE;Pain      OT Treatment/Interventions:      OT Goals(Current goals can be found in the care plan section) Acute Rehab OT Goals Patient Stated Goal: to be in less pain OT Goal Formulation: With patient Time For Goal Achievement: 12/13/22 Potential to Achieve Goals: Good  OT Frequency:      Co-evaluation              AM-PAC OT "6 Clicks" Daily Activity     Outcome Measure Help from another person eating meals?: None Help from another person taking care of personal grooming?: None Help from another person toileting, which includes using toliet, bedpan, or urinal?: A Little Help from another person bathing (including washing, rinsing, drying)?: A Lot Help from another person to put on and taking off regular upper body clothing?: A Little Help from another person to put on and taking off regular lower body clothing?: A Lot 6 Click Score: 18   End of Session  Equipment Utilized During Treatment: Rolling walker (2 wheels);Back brace Nurse Communication: Mobility status  Activity Tolerance: Patient tolerated treatment well Patient left: in bed;with call bell/phone within reach  OT Visit Diagnosis: Unsteadiness on feet (R26.81);Other abnormalities of gait and mobility (R26.89);Muscle weakness (generalized) (M62.81);Pain Pain - part of body:   (Back)                Time: [id number] OT Time Calculation (min): 33 min Charges:  OT General Charges $OT Visit: 1 Visit OT Evaluation $OT Eval Moderate Complexity: 1 Mod OT Treatments $Self Care/Home Management : 8-22 mins  Corinne Ports E. Gamble Enderle, OTR/L Acute Rehabilitation Services [place] 11/29/2022, 9:51 AM

## 2022-11-29 NOTE — Discharge Summary (Signed)
Patient ID: [name] MRN: [id number] DOB/AGE: 03/12/1956 67 y.o.  Admit date: 11/27/2022 Discharge date: 11/29/2022  Admission Diagnoses:  Principal Problem:   Radiculopathy, lumbar region   Discharge Diagnoses:  Same  Past Medical History:  Diagnosis Date   Arthritis    Bilateral hip pain    Chronic pain    cervical, lumbar   Headache    Low back pain    Scoliosis    Spinal stenosis     Surgeries: Procedure(s): LUMBAR 1- LUMBAR 2, LUMBAR 2- LUMBAR 3, LUMBAR 3- LUMBAR 4, LUMBAR 4- LUMBAR 5, LUMBAR 5- SACRUM 1 POSTERIOR DECOMPRESSION AND FUSION WITH INSTRUMENTATION AND ALLOGRAFT on 11/28/2022   Consultants: Treatment Team:  Marty Heck, MD  Discharged Condition: Improved  Hospital Course: Brett Hernandez is an 67 y.o. male who was admitted 11/27/2022 for operative treatment of Radiculopathy, lumbar region. Patient has severe unremitting pain that affects sleep, daily activities, and work/hobbies. After pre-op clearance the patient was taken to the operating room on 11/28/2022 and underwent  Procedure(s): LUMBAR 1- LUMBAR 2, LUMBAR 2- LUMBAR 3, LUMBAR 3- LUMBAR 4, LUMBAR 4- LUMBAR 5, LUMBAR 5- SACRUM 1 POSTERIOR DECOMPRESSION AND FUSION WITH INSTRUMENTATION AND ALLOGRAFT.    Patient was given perioperative antibiotics:  Anti-infectives (From admission, onward)    Start     Dose/Rate Route Frequency Ordered Stop   11/28/22 0600  ceFAZolin (ANCEF) IVPB 2g/100 mL premix        2 g 200 mL/hr over 30 Minutes Intravenous On call to O.R. 11/27/22 1930 11/28/22 0842   11/27/22 2300  ceFAZolin (ANCEF) IVPB 2g/100 mL premix        2 g 200 mL/hr over 30 Minutes Intravenous Every 8 hours 11/27/22 1834 11/28/22 1459   11/27/22 0700  ceFAZolin (ANCEF) IVPB 2g/100 mL premix        2 g 200 mL/hr over 30 Minutes Intravenous On call to O.R. 11/27/22 0658 11/27/22 1550        Patient was given sequential compression devices, early ambulation to prevent DVT.  Patient  benefited maximally from hospital stay and there were no complications.    Recent vital signs: Patient Vitals for the past 24 hrs:  BP Temp Temp src Pulse Resp SpO2  11/29/22 0730 (!) 144/76 (!) 100.4 F (38 C) Oral 75 16 94 %  11/29/22 0333 119/73 99.7 F (37.6 C) Oral 76 18 91 %  11/28/22 2303 (!) 145/79 99 F (37.2 C) Oral 63 18 96 %  11/28/22 2032 135/70 98.3 F (36.8 C) Oral 67 18 94 %  11/28/22 1609 (!) 131/55 -- -- 81 16 92 %  11/28/22 1259 (!) 138/58 98.4 F (36.9 C) Oral 65 18 96 %  11/28/22 1230 (!) 143/73 -- -- 65 (!) 9 94 %  11/28/22 1215 (!) 155/70 -- -- 65 14 94 %  11/28/22 1200 (!) 140/73 -- -- 65 13 92 %  11/28/22 1145 138/71 -- -- 64 11 92 %  11/28/22 1130 136/68 -- -- 65 10 93 %  11/28/22 1115 (!) 148/73 98.3 F (36.8 C) -- 72 12 92 %     Discharge Medications:   Allergies as of 11/29/2022       Reactions   Grass Extracts [gramineae Pollens]    HAY FEVER        Medication List     TAKE these medications    gabapentin 300 MG capsule Commonly known as: NEURONTIN Take 300 mg by mouth 3 (  three) times daily.   guaiFENesin 600 MG 12 hr tablet Commonly known as: MUCINEX Take 600 mg by mouth at bedtime.   methocarbamol 500 MG tablet Commonly known as: ROBAXIN Take 1-2 tablets (500-1,000 mg total) by mouth every 6 (six) hours as needed for muscle spasms.   ondansetron 4 MG tablet Commonly known as: ZOFRAN Take 1 tablet (4 mg total) by mouth every 6 (six) hours as needed for nausea or vomiting.   oxyCODONE 10 mg 12 hr tablet Commonly known as: OXYCONTIN Take 1 tablet (10 mg total) by mouth every 12 (twelve) hours.   oxyCODONE-acetaminophen 7.5-325 MG tablet Commonly known as: PERCOCET Take 1-2 tablets by mouth every 4 (four) hours as needed for moderate pain. What changed:  how much to take when to take this   oxymetazoline 0.05 % nasal spray Commonly known as: AFRIN Place 1 spray into both nostrils every evening.   SUMAtriptan 25 MG  tablet Commonly known as: Imitrex Take 1 tablet (25 mg total) by mouth every 2 (two) hours as needed for migraine. May repeat in 2 hours if headache persists or recurs.   traZODone 50 MG tablet Commonly known as: DESYREL Take 125 mg by mouth at bedtime.        Diagnostic Studies: DG Lumbar Spine 2-3 Views  Result Date: 11/28/2022 CLINICAL DATA:  L1-S1 posterior fusion RST 0 EXAM: LUMBAR SPINE - 2-3 VIEW; DG C-ARM 1-60 MIN-NO REPORT COMPARISON:  09/17/2022 FLUOROSCOPY: Air kerma 159.05 mGy FINDINGS: Intraoperative fluoroscopic images demonstrate posterior lumbar discectomy and fusion from L5-S1. No obvious perihardware fracture or component malpositioning. IMPRESSION: Intraoperative fluoroscopic images demonstrate posterior lumbar discectomy and fusion from L5-S1. No obvious perihardware fracture or component malpositioning. Electronically Signed   By: Delanna Ahmadi M.D.   On: 11/28/2022 11:52   DG C-Arm 1-60 Min-No Report  Result Date: 11/28/2022 Fluoroscopy was utilized by the requesting physician.  No radiographic interpretation.   DG C-Arm 1-60 Min-No Report  Result Date: 11/28/2022 Fluoroscopy was utilized by the requesting physician.  No radiographic interpretation.   DG C-Arm 1-60 Min-No Report  Result Date: 11/28/2022 Fluoroscopy was utilized by the requesting physician.  No radiographic interpretation.   DG C-Arm 1-60 Min-No Report  Result Date: 11/28/2022 Fluoroscopy was utilized by the requesting physician.  No radiographic interpretation.   DG Lumbar Spine 2-3 Views  Result Date: 11/27/2022 CLINICAL DATA:  [id number] Elective surgery [id number] EXAM: LUMBAR SPINE - 2-3 VIEW COMPARISON:  10/07/2022 FINDINGS: Intraoperative images during L1-S1 interbody fusion. Images demonstrate interbody spacers at L3-L4, L4-L5, and L5-S1. IMPRESSION: Intraoperative images during lumbosacral interbody fusion. Electronically Signed   By: Maurine Simmering M.D.   On: 11/27/2022 16:44   DG C-Arm 1-60  Min-No Report  Result Date: 11/27/2022 Fluoroscopy was utilized by the requesting physician.  No radiographic interpretation.   DG C-Arm 1-60 Min-No Report  Result Date: 11/27/2022 Fluoroscopy was utilized by the requesting physician.  No radiographic interpretation.   DG C-Arm 1-60 Min-No Report  Result Date: 11/27/2022 Fluoroscopy was utilized by the requesting physician.  No radiographic interpretation.   DG C-Arm 1-60 Min-No Report  Result Date: 11/27/2022 Fluoroscopy was utilized by the requesting physician.  No radiographic interpretation.   DG C-Arm 1-60 Min-No Report  Result Date: 11/27/2022 Fluoroscopy was utilized by the requesting physician.  No radiographic interpretation.   DG C-Arm 1-60 Min-No Report  Result Date: 11/27/2022 Fluoroscopy was utilized by the requesting physician.  No radiographic interpretation.   DG C-Arm 1-60 Min-No Report  Result  Date: 11/27/2022 Fluoroscopy was utilized by the requesting physician.  No radiographic interpretation.   DG OR LOCAL ABDOMEN  Result Date: 11/27/2022 CLINICAL DATA:  Elective surgery. Status post L4-S1 ALIF. Evaluate for retained foreign body or instrument. EXAM: OR LOCAL ABDOMEN COMPARISON:  Lumbar myelogram CT 09/17/2022. Intraoperative views same date. FINDINGS: 1545 hours. Single AP view of the lower lumbar spine demonstrates interbody spacers at the L3-4, L4-5 and L5-S1 levels. The spacer at L3-4 is consistent within XLIF device. The lower two spacers were placed anteriorly by history. Small vascular clips are noted. No unexpected foreign body or retained instrument identified. No acute osseous findings. IMPRESSION: No evidence of retained surgical instrument or unexpected foreign body following lumbar fusion. These results were called by telephone at the time of interpretation on 11/27/2022 at 3:53 pm to representative of provider Cope DUMONSKI in the operating room, who verbally acknowledged these results. Electronically  Signed   By: Richardean Sale M.D.   On: 11/27/2022 15:59    Disposition: Discharge disposition: 01-Home or Self Care       Discharge Instructions     Discharge patient   Complete by: As directed    After PT   Discharge disposition: 01-Home or Self Care   Discharge patient date: 11/29/2022      POD #1 and 2 after staged LAT/ANT/POST lumbar fusion   - up with PT/OT, encourage ambulation - Percocet for pain, Robaxin for muscle spasms -Scripts for pain sent to pharmacy electronically  -D/C instructions sheet printed and in chart -D/C today  -F/U in office 2 weeks   Signed: Justice Britain 11/29/2022, 8:57 AM

## 2022-11-29 NOTE — Progress Notes (Addendum)
  Progress Note    11/29/2022 7:35 AM 1 Day Post-Op  Subjective:  no complaints   Vitals:   11/29/22 0333 11/29/22 0730  BP: 119/73 (!) 144/76  Pulse: 76 75  Resp: 18 16  Temp: 99.7 F (37.6 C) (!) 100.4 F (38 C)  SpO2: 91% 94%   Physical Exam: Lungs:  non labored Incisions:  abd incision c/d/i Extremities:  symmetrical PT pulses; no significant edema of BLE Abd: soft, ND Neurologic: A&O  CBC    Component Value Date/Time   WBC 9.1 11/19/2022 1343   RBC 4.94 11/19/2022 1343   HGB 13.9 11/19/2022 1343   HCT 43.2 11/19/2022 1343   PLT 307 11/19/2022 1343   MCV 87.4 11/19/2022 1343   MCH 28.1 11/19/2022 1343   MCHC 32.2 11/19/2022 1343   RDW 12.9 11/19/2022 1343    BMET    Component Value Date/Time   NA 139 11/19/2022 1343   K 4.2 11/19/2022 1343   CL 103 11/19/2022 1343   CO2 27 11/19/2022 1343   GLUCOSE 125 (H) 11/19/2022 1343   BUN 13 11/19/2022 1343   CREATININE 1.21 11/19/2022 1343   CALCIUM 9.2 11/19/2022 1343   GFRNONAA >60 11/19/2022 1343    INR No results found for: "INR"   Intake/Output Summary (Last 24 hours) at 11/29/2022 0735 Last data filed at 11/28/2022 2130 Gross per 24 hour  Intake 2160 ml  Output 800 ml  Net 1360 ml     Assessment/Plan:  67 y.o. male is s/p anterior spine exposure for 2 level ALIF 1 Day Post-Op   Incision is well appearing Tolerating regular diet this morning BLE well perfused with palpable PT pulses Ok for discharge from vascular standpoint   Dagoberto Ligas, PA-C [facility] [phone] 11/29/2022 7:35 AM  I have seen and evaluated the patient. I agree with the PA note as documented above.  Postop day 2 status post anterior spine exposure for L4-L5 and L5-S1 ALIF.  Went back to surgery yesterday for posterior instrumentation.  Abdomen remains soft with appropriate post-op incisional tenderness.  No nausea or vomiting overnight.  Palpable pedal pulse in the left foot.  Main complaint is  back pain from posterior instrumentation.  Vascular available if needed.  Marty Heck, MD [facility] Office: [phone]

## 2022-12-03 ENCOUNTER — Encounter (HOSPITAL_COMMUNITY): Payer: Self-pay | Admitting: Orthopedic Surgery

## 2022-12-05 ENCOUNTER — Encounter (HOSPITAL_COMMUNITY): Payer: Self-pay | Admitting: Orthopedic Surgery

## 2022-12-06 ENCOUNTER — Encounter (HOSPITAL_COMMUNITY): Payer: Self-pay | Admitting: Orthopedic Surgery

## 2022-12-22 ENCOUNTER — Encounter (HOSPITAL_COMMUNITY): Payer: Self-pay

## 2022-12-22 ENCOUNTER — Other Ambulatory Visit: Payer: Self-pay

## 2022-12-22 ENCOUNTER — Emergency Department (HOSPITAL_COMMUNITY)
Admission: EM | Admit: 2022-12-22 | Discharge: 2022-12-22 | Disposition: A | Discharge status: Home / Self Care | Payer: 59 | Source: Home / Self Care | Attending: Emergency Medicine | Admitting: Emergency Medicine

## 2022-12-22 ENCOUNTER — Emergency Department (HOSPITAL_COMMUNITY)
Admission: EM | Admit: 2022-12-22 | Discharge: 2022-12-22 | Disposition: A | Discharge status: Home / Self Care | Payer: 59 | Attending: Emergency Medicine | Admitting: Emergency Medicine

## 2022-12-22 DIAGNOSIS — G8918 Other acute postprocedural pain: Secondary | ICD-10-CM

## 2022-12-22 DIAGNOSIS — I10 Essential (primary) hypertension: Secondary | ICD-10-CM | POA: Insufficient documentation

## 2022-12-22 DIAGNOSIS — R52 Pain, unspecified: Secondary | ICD-10-CM | POA: Insufficient documentation

## 2022-12-22 MED ORDER — HYDROMORPHONE HCL 2 MG PO TABS: 1.0000 mg | ORAL_TABLET | ORAL | 0 refills | Status: DC | PRN

## 2022-12-22 MED ORDER — OXYCODONE-ACETAMINOPHEN 5-325 MG PO TABS
2.0000 | ORAL_TABLET | Freq: Once | ORAL | Status: AC
  Administered 2022-12-22: 2 via ORAL
  Filled 2022-12-22: qty 2

## 2022-12-22 MED ORDER — HYDROMORPHONE HCL 1 MG/ML IJ SOLN
1.0000 mg | Freq: Once | INTRAMUSCULAR | Status: AC
  Administered 2022-12-22: 1 mg via INTRAMUSCULAR
  Filled 2022-12-22: qty 1

## 2022-12-22 NOTE — ED Triage Notes (Signed)
Pt arrived POV from home c/o back pain. Pt had a spinal fusion 2 weeks ago. Pt states he needs more pain medicine that his dr and his pain management dr have not been communicating. Pt states he has no more pain meds at home and needs something.

## 2022-12-22 NOTE — ED Triage Notes (Signed)
Pt with post op pain after spinal fusion 2 weeks ago. Seen earlier today for same. Pain is not controlled.

## 2022-12-22 NOTE — Discharge Instructions (Signed)
You were seen in the emergency department for your postop pain.  Your back appears to be healing appropriately without any signs of infection or injury to your nerves.  We gave you a dose of pain medication in the ER however you need to follow-up with your primary doctor and your pain management doctor for further refills and to determine the most appropriate pain plan for you.  You should return to the emergency department if you are having fevers, worsening numbness or weakness in your legs or groin, you are unable to urinate or if you have any other new or concerning symptoms.

## 2022-12-22 NOTE — Discharge Instructions (Signed)
Please be sure to follow-up with Dr. Yevette Edwards tomorrow.  Return here for concerning changes in your condition.

## 2022-12-22 NOTE — ED Provider Notes (Signed)
[place] EMERGENCY DEPARTMENT AT [facility] Provider Note   CSN: [id number] Arrival date & time: 12/22/22  5638     History  Chief Complaint  Patient presents with   Back Pain    Brett Hernandez is a 67 y.o. male.  HPI Patient presents for the second time today.  He is accompanied by his wife who assists with the history.  In essence the patient returns due to persistent pain.  Presentation from earlier this morning well-described, included below.  In essence the patient has a history of chronic pain, had lumbar spine procedure last month.  Since that time he has been struggling with obtaining a sufficient amount of pain control, in spite of attempts to contact his orthopedist and his chronic pain physician.  Today, no new complaints since discharge other than a recurrence of his pain after taking his last dose of oral pain medication.  HPI from earlier today: Patient is a 67 year old male with a past medical history of lumbar radiculopathy status post lumbar spinal fusion about 3 weeks ago presenting to the emergency department with back pain. The patient states that postop he has been following with his surgeon as well as his pain management doctor. He states that there has been some confusion about what he is supposed to be on for his pain plan. He states that he was told by his orthopedic doctor that he should be taking 5 to 10 mg of Percocet every 4 hours for pain but was prescribed 7.5 mg of Percocet every 6 hours by his pain doctor. He states that he saw his orthopedic PA in the office last week who tried to prescribe him a few additional tabs but was told by pharmacy that he was unable to fill this as his last prescription has not yet expired. The patient states that he took his last dose of pain medication this morning. He denies any new trauma or falls, numbness or weakness, fevers or chills.     Home Medications Prior to Admission medications   Medication Sig Start  Date End Date Taking? Authorizing Provider  HYDROmorphone (DILAUDID) 2 MG tablet Take 0.5 tablets (1 mg total) by mouth every 4 (four) hours as needed for severe pain. 12/22/22  Yes Gerhard Munch, MD  gabapentin (NEURONTIN) 300 MG capsule Take 300 mg by mouth 3 (three) times daily. 10/11/21   [provider]  guaiFENesin (MUCINEX) 600 MG 12 hr tablet Take 600 mg by mouth at bedtime.    [provider]  methocarbamol (ROBAXIN) 500 MG tablet Take 1-2 tablets (500-1,000 mg total) by mouth every 6 (six) hours as needed for muscle spasms. 11/29/22   McKenzie, Eilene Ghazi, PA-C  ondansetron (ZOFRAN) 4 MG tablet Take 1 tablet (4 mg total) by mouth every 6 (six) hours as needed for nausea or vomiting. 11/29/22   McKenzie, Eilene Ghazi, PA-C  oxymetazoline (AFRIN) 0.05 % nasal spray Place 1 spray into both nostrils every evening.    [provider]  SUMAtriptan (IMITREX) 25 MG tablet Take 1 tablet (25 mg total) by mouth every 2 (two) hours as needed for migraine. May repeat in 2 hours if headache persists or recurs. Patient not taking: Reported on 11/15/2022 11/16/21   Levert Feinstein, MD  traZODone (DESYREL) 50 MG tablet Take 125 mg by mouth at bedtime. 10/31/21   [provider]      Allergies    Grass extracts [gramineae pollens]    Review of Systems   Review  of Systems  All other systems reviewed and are negative.   Physical Exam Updated Vital Signs BP (!) 178/102   Pulse 74   Temp 98.7 F (37.1 C) (Oral)   Resp 18   SpO2 95%  Physical Exam Vitals and nursing note reviewed.  Constitutional:      General: He is not in acute distress.    Appearance: He is well-developed.  HENT:     Head: Normocephalic and atraumatic.  Eyes:     Conjunctiva/sclera: Conjunctivae normal.  Cardiovascular:     Rate and Rhythm: Normal rate and regular rhythm.  Pulmonary:     Effort: Pulmonary effort is normal. No respiratory distress.     Breath sounds: No stridor.  Abdominal:      General: There is no distension.  Musculoskeletal:     Comments: MAES, denies new deformity or different pain.  Skin:    General: Skin is warm and dry.  Neurological:     Mental Status: He is alert and oriented to person, place, and time.  Psychiatric:        Mood and Affect: Mood normal.     ED Results / Procedures / Treatments   Labs (all labs ordered are listed, but only abnormal results are displayed) Labs Reviewed - No data to display  EKG None  Radiology No results found.  Procedures Procedures    Medications Ordered in ED Medications  HYDROmorphone (DILAUDID) injection 1 mg (has no administration in time range)  HYDROmorphone (DILAUDID) injection 1 mg (1 mg Intramuscular Given 12/22/22 1716)    ED Course/ Medical Decision Making/ A&P                             Medical Decision Making Patient with multiple medical issues, most relevantly chronic pain, exacerbated after recent back procedure.  Patient is awake, alert, hemodynamically unremarkable aside from mild hypertension, has no new neurocomplaints, nor findings.  Suspicion for acute on chronic pain.  He, his wife and I had a lengthy conversation about his history, reasonable attempts for pain relief, things that are realistic given ED evaluation, monitoring and management.  Patient received IM Dilaudid, subsequently had substantial improvement here, will be discharged with a short course of this medication to follow-up with his orthopedist soon as possible and/or primary care/chronic pain management.   Amount and/or Complexity of Data Reviewed Independent Historian: spouse External Data Reviewed: notes.    Details: Notes from today and prior back surgery reviewed Radiology: independent interpretation performed.    Details: Prior radiographic studies reviewed  Risk Prescription drug management. Decision regarding hospitalization.        Final Clinical Impression(s) / ED Diagnoses Final diagnoses:   Pain    Rx / DC Orders ED Discharge Orders          Ordered    HYDROmorphone (DILAUDID) 2 MG tablet  Every 4 hours PRN        12/22/22 1811              Gerhard Munch, MD 12/22/22 1819

## 2022-12-22 NOTE — ED Provider Notes (Signed)
[facility] AT [facility] Provider Note   CSN: [id number] Arrival date & time: 12/22/22  1020     History  Chief Complaint  Patient presents with   Back Pain    Brett Hernandez is a 67 y.o. male.  Patient is a 67 year old male with a past medical history of lumbar radiculopathy status post lumbar spinal fusion about 3 weeks ago presenting to the emergency department with back pain.  The patient states that postop he has been following with his surgeon as well as his pain management doctor.  He states that there has been some confusion about what he is supposed to be on for his pain plan.  He states that he was told by his orthopedic doctor that he should be taking 5 to 10 mg of Percocet every 4 hours for pain but was prescribed 7.5 mg of Percocet every 6 hours by his pain doctor.  He states that he saw his orthopedic PA in the office last week who tried to prescribe him a few additional tabs but was told by pharmacy that he was unable to fill this as his last prescription has not yet expired.  The patient states that he took his last dose of pain medication this morning.  He denies any new trauma or falls, numbness or weakness, fevers or chills.  The history is provided by the patient.  Back Pain      Home Medications Prior to Admission medications   Medication Sig Start Date End Date Taking? Authorizing Provider  gabapentin (NEURONTIN) 300 MG capsule Take 300 mg by mouth 3 (three) times daily. 10/11/21   [provider]  guaiFENesin (MUCINEX) 600 MG 12 hr tablet Take 600 mg by mouth at bedtime.    [provider]  methocarbamol (ROBAXIN) 500 MG tablet Take 1-2 tablets (500-1,000 mg total) by mouth every 6 (six) hours as needed for muscle spasms. 11/29/22   McKenzie, Eilene Ghazi, PA-C  ondansetron (ZOFRAN) 4 MG tablet Take 1 tablet (4 mg total) by mouth every 6 (six) hours as needed for nausea or vomiting. 11/29/22   McKenzie, Eilene Ghazi, PA-C   oxymetazoline (AFRIN) 0.05 % nasal spray Place 1 spray into both nostrils every evening.    [provider]  SUMAtriptan (IMITREX) 25 MG tablet Take 1 tablet (25 mg total) by mouth every 2 (two) hours as needed for migraine. May repeat in 2 hours if headache persists or recurs. Patient not taking: Reported on 11/15/2022 11/16/21   Levert Feinstein, MD  traZODone (DESYREL) 50 MG tablet Take 125 mg by mouth at bedtime. 10/31/21   [provider]      Allergies    Grass extracts [gramineae pollens]    Review of Systems   Review of Systems  Musculoskeletal:  Positive for back pain.    Physical Exam Updated Vital Signs BP 112/78 (BP Location: Right Arm)   Pulse 88   Temp 98.8 F (37.1 C) (Oral)   Resp 16   Ht 5\' 8"  (1.727 m)   Wt 74.8 kg   SpO2 100%   BMI 25.09 kg/m  Physical Exam Vitals and nursing note reviewed.  Constitutional:      General: He is not in acute distress.    Appearance: Normal appearance.  HENT:     Head: Normocephalic and atraumatic.     Nose: Nose normal.     Mouth/Throat:     Mouth: Mucous membranes are moist.     Pharynx:  Oropharynx is clear.  Eyes:     Extraocular Movements: Extraocular movements intact.     Conjunctiva/sclera: Conjunctivae normal.  Cardiovascular:     Rate and Rhythm: Normal rate and regular rhythm.     Heart sounds: Normal heart sounds.  Pulmonary:     Effort: Pulmonary effort is normal.     Breath sounds: Normal breath sounds.  Abdominal:     General: Abdomen is flat.     Palpations: Abdomen is soft.     Tenderness: There is no abdominal tenderness.  Musculoskeletal:        General: Normal range of motion.     Cervical back: Normal range of motion and neck supple.     Comments: No midline back tenderness  Skin:    General: Skin is warm and dry.     Comments: Surgical scars on back, lower mid abdomen and L flank all appear well healing without surrounding erythema, warmth or drainage  Neurological:     Mental  Status: He is alert and oriented to person, place, and time. Mental status is at baseline.     Sensory: No sensory deficit.     Motor: No weakness (5/5 strength in bilateral LE).  Psychiatric:        Mood and Affect: Mood normal.        Behavior: Behavior normal.     ED Results / Procedures / Treatments   Labs (all labs ordered are listed, but only abnormal results are displayed) Labs Reviewed - No data to display  EKG None  Radiology No results found.  Procedures Procedures    Medications Ordered in ED Medications  oxyCODONE-acetaminophen (PERCOCET/ROXICET) 5-325 MG per tablet 2 tablet (has no administration in time range)  oxyCODONE-acetaminophen (PERCOCET/ROXICET) 5-325 MG per tablet 2 tablet (has no administration in time range)    ED Course/ Medical Decision Making/ A&P                             Medical Decision Making This patient presents to the ED with chief complaint(s) of post-op pain with pertinent past medical history of lumbar radiculopathy s/p spinal fusion which further complicates the presenting complaint. The complaint involves an extensive differential diagnosis and also carries with it a high risk of complications and morbidity.    The differential diagnosis includes patient had no trauma or falls and has no midline back tenderness making fracture dislocation unlikely, he has no fever or focal neurologic deficits making epidural abscess or cauda equina unlikely, likely postop pain, chronic pain  Additional history obtained: Additional history obtained from N/A Records reviewed previous admission documents and Care Everywhere/External Records  ED Course and Reassessment: On patient's arrival to the emergency department he is well-appearing in no acute distress.  He has no focal neurologic deficits on exam and wounds appear well-healing without signs of infection and he has no infectious symptoms on history or exam.  I did review the patient's PDMP and  he was last prescribed 120 tabs of 7.5 mg Percocet on 3/26 for a 30-day supply.  I am unable to refill the patient's medication as his prescription has not yet expired however offered to give him 1 dose of Percocet now and 1 dose to go home with for tonight.  He plans to call his surgeon and pain management doctor in the morning to discuss his plain plan and further refills of his pain medications in the future.  He was given  strict return precautions.  Independent labs interpretation:  N/A  Independent visualization of imaging: N/A  Consultation: - Consulted or discussed management/test interpretation w/ external professional: N/A  Consideration for admission or further workup: Patient has no emergent conditions requiring admission or further work-up at this time and is stable for discharge home with orthopedics and pain management follow-up  Social Determinants of health: N/A    Risk Prescription drug management.          Final Clinical Impression(s) / ED Diagnoses Final diagnoses:  Post-op pain    Rx / DC Orders ED Discharge Orders     None         Rexford Maus, DO 12/22/22 1124

## 2023-02-18 IMAGING — CT CT CERVICAL SPINE W/ CM
2 series · 10 of 14 positions shown, 12 images · non-contrast
Comparison: none

CLINICAL DATA: Craniocervical pain syndrome. Headaches.
Cervicalgia.
TECHNIQUE: Contiguous axial images were obtained through the cervical spine
after the intrathecal infusion of contrast. Coronal and sagittal
reconstructions were obtained of the axial image sets.

[Series 2: cspine soft · axial · 0.30mm/px · z∈[-371,-235]mm · 5 of 102 slices shown]
[im 17/102  soft-tissue]
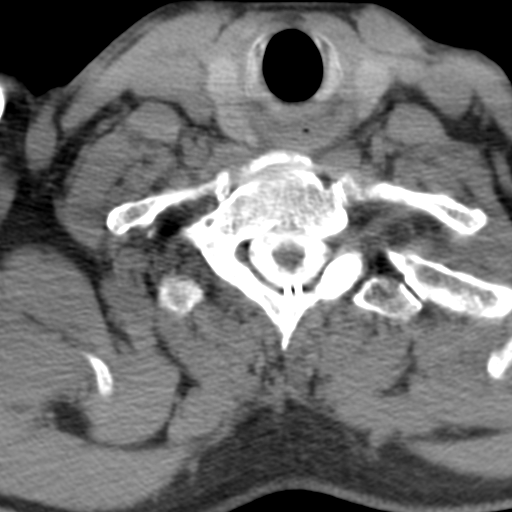
[im 34/102  soft-tissue]
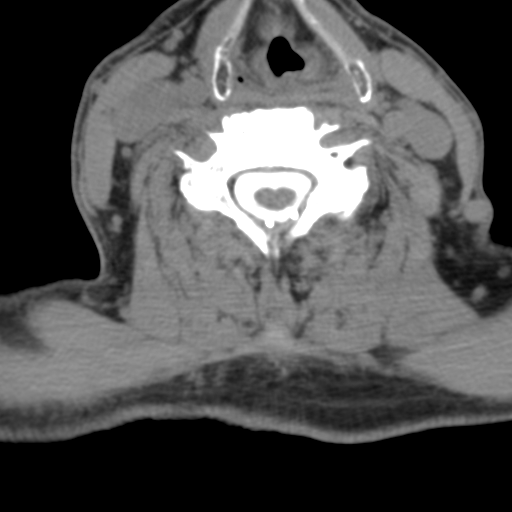
[im 51/102  soft-tissue]
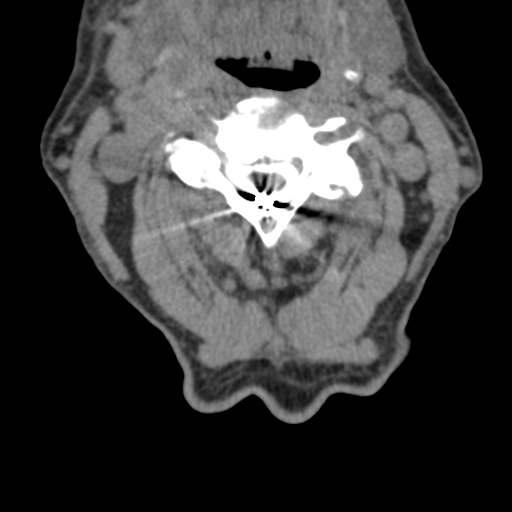
[im 68/102  soft-tissue]
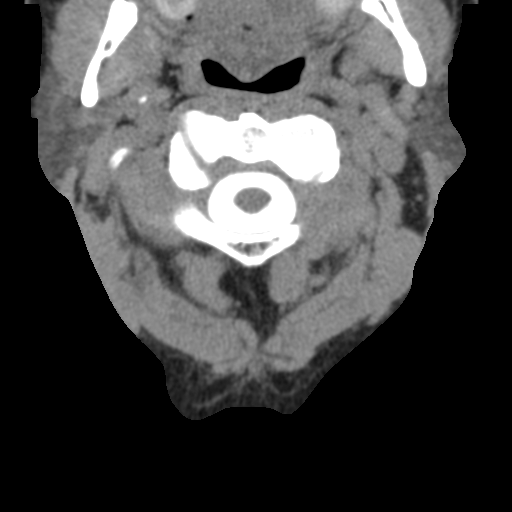
[im 85/102  soft-tissue]
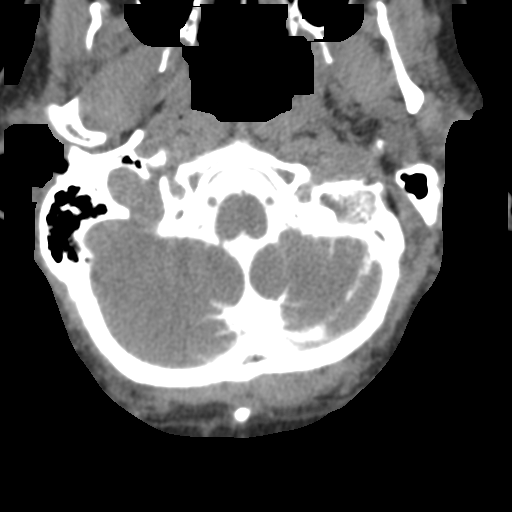

[Series 8: angled axial soft · axial · 0.29mm/px · z∈[-394,-253]mm · 5 of 109 slices shown, 7 images]
[im 19/109  soft-tissue]
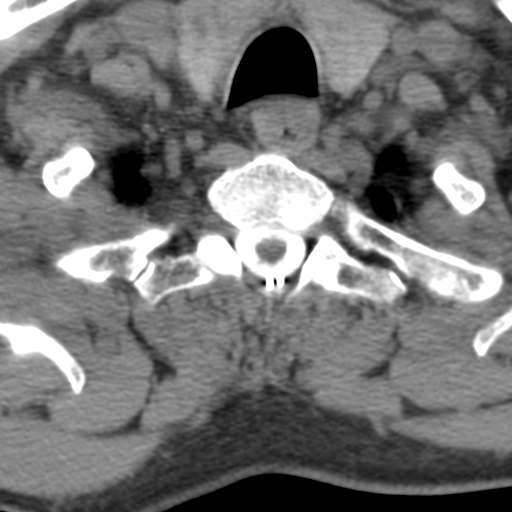
[im 19/109  bone]
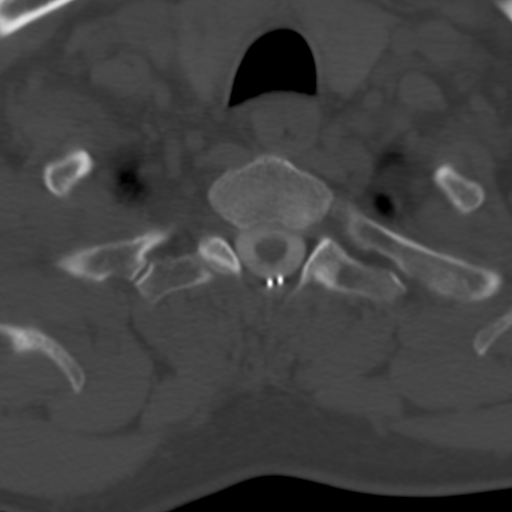
[im 37/109  bone]
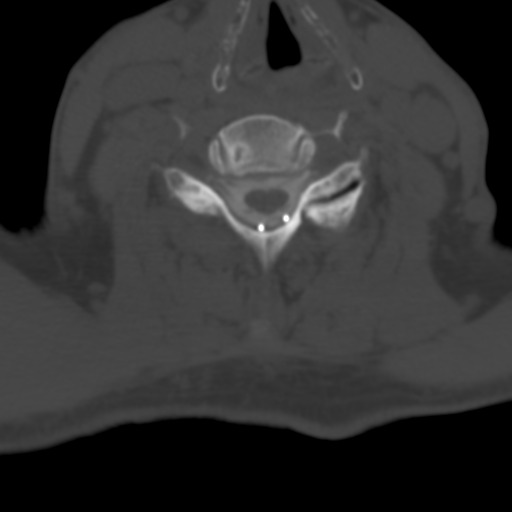
[im 55/109  bone]
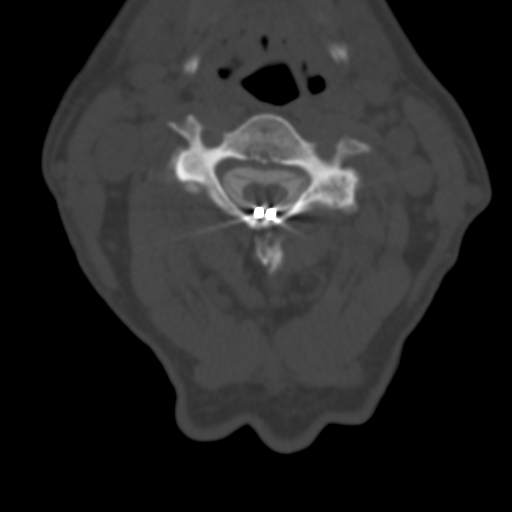
[im 73/109  bone]
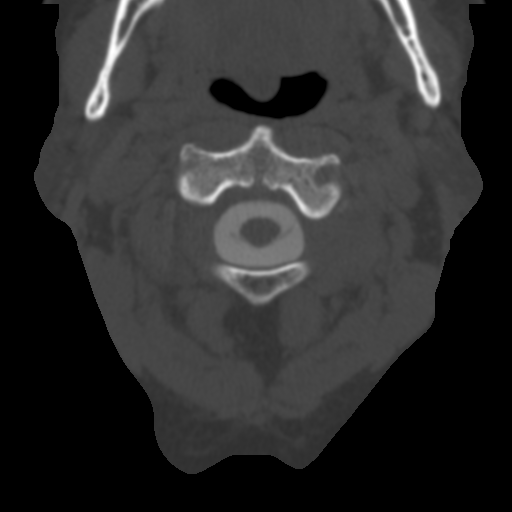
[im 91/109  soft-tissue]
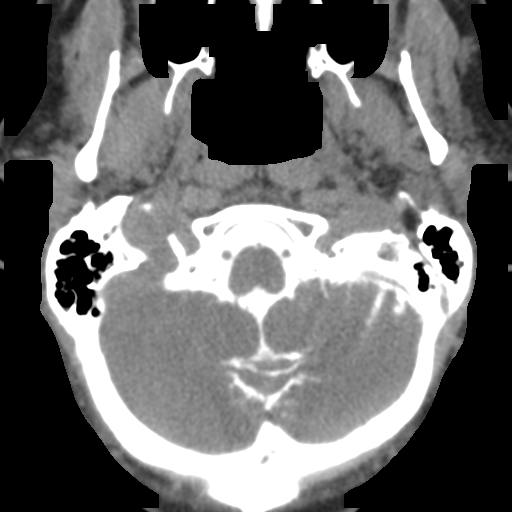
[im 91/109  bone]
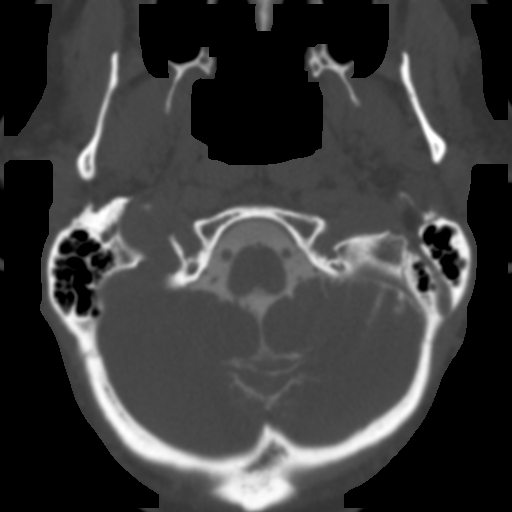

[10 of 14 positions shown; findings below may reference images not displayed]

EXAM:
CERVICAL MYELOGRAM

CT CERVICAL MYELOGRAM

FLUOROSCOPY TIME:  1 minute 49 seconds. 203.39 micro gray meter
squared

PROCEDURE:
LUMBAR PUNCTURE FOR CERVICAL MYELOGRAM

After thorough discussion of risks and benefits of the procedure
including bleeding, infection, injury to nerves, blood vessels,
adjacent structures as well as headache and CSF leak, written and
oral informed consent was obtained. Consent was obtained by Dr. Fatouma Ali
Liebner. We discussed the high likelihood of obtaining a diagnostic
study.

Patient was positioned prone on the fluoroscopy table. Local
anesthesia was provided with 1% lidocaine without epinephrine after
prepped and draped in the usual sterile fashion. Puncture was
performed at L3-4 using a 3 1/2 inch 22-gauge spinal needle via
right paramedian approach. Using a single pass through the dura, the
needle was placed within the thecal sac, with return of clear CSF.
10 mL of Isovue C-077 was injected into the thecal sac, with normal
opacification of the nerve roots and cauda equina consistent with
free flow within the subarachnoid space. The patient was then moved
to the trendelenburg position and contrast flowed into the cervical
spine region.

I personally performed the lumbar puncture and administered the
intrathecal contrast. I also personally performed acquisition of the
myelogram images.
FINDINGS: CERVICAL MYELOGRAM FINDINGS:

No central canal stenosis. Small anterior extradural defects from
C2-3 through C6-7. Diminished root sleeve filling on the right at
C4-5. neurostimulator leads in the dorsal midline.

CT CERVICAL MYELOGRAM FINDINGS:

Foramen magnum is widely patent. No evidence of degenerative change
at the articulation of the occipital condyles and lateral masses of
C1.

C1-2: Ordinary mild osteoarthritis at the articulation of the
anterior arch in the dens. No encroachment upon the neural spaces.

C2-3: Chronic disc space narrowing. Small endplate osteophytes. Mild
bilateral facet osteoarthritis. Mild foraminal narrowing on the
left.

C3-4: Chronic disc space narrowing. Endplate osteophytes without
disc herniation. Mild facet osteoarthritis on the right and moderate
facet osteoarthritis on the left. Mild bony foraminal narrowing on
the right and moderate bony foraminal narrowing on the left.

C4-5: Small endplate osteophytes and mild bulging of the disc. Facet
osteoarthritis worse on the right than the left. Right foraminal
stenosis that could compress the right C5 nerve. Mild foraminal
narrowing on the left.

C5-6: Developing fusion across the disc space with complete loss of
disc height. Endplate osteophytes. Bony foraminal narrowing right
more than left with some potential to affect the C6 nerves.

C6-7: Mild bulging of the disc. Facet osteoarthritis on the left. No
canal or foraminal stenosis.

C7-T1: No disc abnormality. Facet osteoarthritis on the left. No
compressive stenosis.

Neuro stimulators in the dorsal midline. Stimulator to the right of
midline extends from C2 to the C4-5 disc level. The neurostimulator
on the left extends from the C3-4 disc level to the C5-6 disc level.
IMPRESSION: Neuro stimulators in the dorsal midline.

No degenerative change at the craniocervical articulation. Ordinary
mild osteoarthritis at the articulation of the anterior arch of C1
and the dens.

C2-3: Chronic disc degeneration. Mild bilateral facet arthritis.
Mild left foraminal narrowing.

C3-4: Chronic disc degeneration. Facet osteoarthritis worse on the
left. Left worse than right foraminal narrowing. Mild right
foraminal narrowing and moderate left foraminal narrowing.

C4-5: Endplate osteophytes, bulging of the disc and facet
osteoarthritis worse on the right. Right foraminal stenosis likely
to compress the right C5 nerve. This is consistent with the
myelographic imaging.

C5-6: Chronic disc degeneration with developing fusion across the
disc space. Bilateral bony foraminal stenosis that could affect
either C6 nerve

C6-7 and C7-T1: Left-sided facet osteoarthritis. No compressive
stenosis.

## 2023-06-02 DIAGNOSIS — M25552 Pain in left hip: Secondary | ICD-10-CM | POA: Diagnosis not present

## 2023-06-02 DIAGNOSIS — M160 Bilateral primary osteoarthritis of hip: Secondary | ICD-10-CM | POA: Diagnosis not present

## 2023-06-23 DIAGNOSIS — M7062 Trochanteric bursitis, left hip: Secondary | ICD-10-CM | POA: Diagnosis not present

## 2023-07-15 DIAGNOSIS — M25562 Pain in left knee: Secondary | ICD-10-CM | POA: Diagnosis not present

## 2023-07-17 DIAGNOSIS — H9313 Tinnitus, bilateral: Secondary | ICD-10-CM | POA: Diagnosis not present

## 2023-07-17 DIAGNOSIS — H903 Sensorineural hearing loss, bilateral: Secondary | ICD-10-CM | POA: Diagnosis not present

## 2023-07-23 DIAGNOSIS — H2513 Age-related nuclear cataract, bilateral: Secondary | ICD-10-CM | POA: Diagnosis not present

## 2023-07-29 DIAGNOSIS — G894 Chronic pain syndrome: Secondary | ICD-10-CM | POA: Diagnosis not present

## 2023-07-29 DIAGNOSIS — M5412 Radiculopathy, cervical region: Secondary | ICD-10-CM | POA: Diagnosis not present

## 2023-07-29 DIAGNOSIS — M169 Osteoarthritis of hip, unspecified: Secondary | ICD-10-CM | POA: Diagnosis not present

## 2023-07-29 DIAGNOSIS — M25562 Pain in left knee: Secondary | ICD-10-CM | POA: Diagnosis not present

## 2023-07-29 DIAGNOSIS — M5416 Radiculopathy, lumbar region: Secondary | ICD-10-CM | POA: Diagnosis not present

## 2023-07-29 DIAGNOSIS — M47816 Spondylosis without myelopathy or radiculopathy, lumbar region: Secondary | ICD-10-CM | POA: Diagnosis not present

## 2023-07-29 DIAGNOSIS — M47812 Spondylosis without myelopathy or radiculopathy, cervical region: Secondary | ICD-10-CM | POA: Diagnosis not present

## 2023-07-29 DIAGNOSIS — Z5181 Encounter for therapeutic drug level monitoring: Secondary | ICD-10-CM | POA: Diagnosis not present

## 2023-07-29 DIAGNOSIS — Z79891 Long term (current) use of opiate analgesic: Secondary | ICD-10-CM | POA: Diagnosis not present

## 2023-08-12 DIAGNOSIS — M545 Low back pain, unspecified: Secondary | ICD-10-CM | POA: Diagnosis not present

## 2023-08-12 DIAGNOSIS — M5412 Radiculopathy, cervical region: Secondary | ICD-10-CM | POA: Diagnosis not present

## 2023-08-19 DIAGNOSIS — H2513 Age-related nuclear cataract, bilateral: Secondary | ICD-10-CM | POA: Diagnosis not present

## 2023-08-19 DIAGNOSIS — H18513 Endothelial corneal dystrophy, bilateral: Secondary | ICD-10-CM | POA: Diagnosis not present

## 2023-08-19 DIAGNOSIS — M1712 Unilateral primary osteoarthritis, left knee: Secondary | ICD-10-CM | POA: Diagnosis not present

## 2023-08-19 DIAGNOSIS — M1612 Unilateral primary osteoarthritis, left hip: Secondary | ICD-10-CM | POA: Diagnosis not present

## 2023-08-29 DIAGNOSIS — H2511 Age-related nuclear cataract, right eye: Secondary | ICD-10-CM | POA: Diagnosis not present

## 2023-08-29 DIAGNOSIS — H18511 Endothelial corneal dystrophy, right eye: Secondary | ICD-10-CM | POA: Diagnosis not present

## 2023-08-29 DIAGNOSIS — R200 Anesthesia of skin: Secondary | ICD-10-CM | POA: Diagnosis not present

## 2023-09-16 DIAGNOSIS — E7800 Pure hypercholesterolemia, unspecified: Secondary | ICD-10-CM | POA: Diagnosis not present

## 2023-09-16 DIAGNOSIS — R7303 Prediabetes: Secondary | ICD-10-CM | POA: Diagnosis not present

## 2023-09-16 DIAGNOSIS — G8929 Other chronic pain: Secondary | ICD-10-CM | POA: Diagnosis not present

## 2023-09-16 DIAGNOSIS — Z01818 Encounter for other preprocedural examination: Secondary | ICD-10-CM | POA: Diagnosis not present

## 2023-09-16 DIAGNOSIS — R7989 Other specified abnormal findings of blood chemistry: Secondary | ICD-10-CM | POA: Diagnosis not present

## 2023-09-23 DIAGNOSIS — Z01818 Encounter for other preprocedural examination: Secondary | ICD-10-CM | POA: Diagnosis not present

## 2023-09-23 DIAGNOSIS — H2511 Age-related nuclear cataract, right eye: Secondary | ICD-10-CM | POA: Diagnosis not present

## 2023-09-23 DIAGNOSIS — H18511 Endothelial corneal dystrophy, right eye: Secondary | ICD-10-CM | POA: Diagnosis not present

## 2023-10-01 DIAGNOSIS — G5603 Carpal tunnel syndrome, bilateral upper limbs: Secondary | ICD-10-CM | POA: Diagnosis not present

## 2023-10-01 DIAGNOSIS — G5602 Carpal tunnel syndrome, left upper limb: Secondary | ICD-10-CM | POA: Diagnosis not present

## 2023-10-01 DIAGNOSIS — G5601 Carpal tunnel syndrome, right upper limb: Secondary | ICD-10-CM | POA: Diagnosis not present

## 2023-10-02 DIAGNOSIS — M5412 Radiculopathy, cervical region: Secondary | ICD-10-CM | POA: Diagnosis not present

## 2023-10-02 DIAGNOSIS — Z5181 Encounter for therapeutic drug level monitoring: Secondary | ICD-10-CM | POA: Diagnosis not present

## 2023-10-02 DIAGNOSIS — M169 Osteoarthritis of hip, unspecified: Secondary | ICD-10-CM | POA: Diagnosis not present

## 2023-10-02 DIAGNOSIS — G894 Chronic pain syndrome: Secondary | ICD-10-CM | POA: Diagnosis not present

## 2023-10-02 DIAGNOSIS — M47812 Spondylosis without myelopathy or radiculopathy, cervical region: Secondary | ICD-10-CM | POA: Diagnosis not present

## 2023-10-02 DIAGNOSIS — M47816 Spondylosis without myelopathy or radiculopathy, lumbar region: Secondary | ICD-10-CM | POA: Diagnosis not present

## 2023-10-02 DIAGNOSIS — M5416 Radiculopathy, lumbar region: Secondary | ICD-10-CM | POA: Diagnosis not present

## 2023-10-02 DIAGNOSIS — M25562 Pain in left knee: Secondary | ICD-10-CM | POA: Diagnosis not present

## 2023-10-02 DIAGNOSIS — Z79891 Long term (current) use of opiate analgesic: Secondary | ICD-10-CM | POA: Diagnosis not present

## 2023-10-07 DIAGNOSIS — M5412 Radiculopathy, cervical region: Secondary | ICD-10-CM | POA: Diagnosis not present

## 2023-10-08 DIAGNOSIS — H25811 Combined forms of age-related cataract, right eye: Secondary | ICD-10-CM | POA: Diagnosis not present

## 2023-10-08 DIAGNOSIS — H18511 Endothelial corneal dystrophy, right eye: Secondary | ICD-10-CM | POA: Diagnosis not present

## 2023-10-08 DIAGNOSIS — H2511 Age-related nuclear cataract, right eye: Secondary | ICD-10-CM | POA: Diagnosis not present

## 2023-10-14 DIAGNOSIS — R200 Anesthesia of skin: Secondary | ICD-10-CM | POA: Diagnosis not present

## 2023-10-24 DIAGNOSIS — H9042 Sensorineural hearing loss, unilateral, left ear, with unrestricted hearing on the contralateral side: Secondary | ICD-10-CM | POA: Diagnosis not present

## 2023-10-28 DIAGNOSIS — M1612 Unilateral primary osteoarthritis, left hip: Secondary | ICD-10-CM | POA: Diagnosis not present

## 2023-10-29 ENCOUNTER — Other Ambulatory Visit: Payer: Self-pay | Admitting: Orthopedic Surgery

## 2023-10-30 DIAGNOSIS — M25652 Stiffness of left hip, not elsewhere classified: Secondary | ICD-10-CM | POA: Diagnosis not present

## 2023-10-30 DIAGNOSIS — M1612 Unilateral primary osteoarthritis, left hip: Secondary | ICD-10-CM | POA: Diagnosis not present

## 2023-10-30 DIAGNOSIS — R262 Difficulty in walking, not elsewhere classified: Secondary | ICD-10-CM | POA: Diagnosis not present

## 2023-10-30 NOTE — Progress Notes (Signed)
Sent message, via epic in basket, requesting orders in epic from Careers adviser.

## 2023-10-31 ENCOUNTER — Encounter: Payer: Self-pay | Admitting: Orthopedic Surgery

## 2023-10-31 ENCOUNTER — Other Ambulatory Visit: Payer: Self-pay | Admitting: Orthopedic Surgery

## 2023-10-31 DIAGNOSIS — M5412 Radiculopathy, cervical region: Secondary | ICD-10-CM

## 2023-11-04 ENCOUNTER — Encounter (HOSPITAL_COMMUNITY): Payer: Self-pay

## 2023-11-04 ENCOUNTER — Other Ambulatory Visit: Payer: Self-pay

## 2023-11-04 NOTE — Care Plan (Signed)
Ortho Bundle Case Management Note  Patient Details  Name: Brett Hernandez MRN: [id number] Date of Birth: 1956/03/10    met with patient in the office for H&P. will discharge to home with family to assist. has Rolling walker. OPPT set up with [facility] [facility] discharge instructions discussed and questions answered. Patient and MD in agreement. Choice offered   DME Arranged:    DME Agency:     HH Arranged:    HH Agency:     Additional Comments: Please contact me with any questions of if this plan should need to change.  Shauna Hugh,  RN,BSN,MHA,CCM  [facility]  [phone] 11/04/2023, 1:51 PM

## 2023-11-04 NOTE — Patient Instructions (Addendum)
SURGICAL WAITING ROOM VISITATION  Patients having surgery or a procedure may have no more than 2 support people in the waiting area - these visitors may rotate.    Children under the age of 88 must have an adult with them who is not the patient.  Due to an increase in RSV and influenza rates and associated hospitalizations, children ages 88 and under may not visit patients in [facility] hospitals.  Visitors with respiratory illnesses are discouraged from visiting and should remain at home.  If the patient needs to stay at the hospital during part of their recovery, the visitor guidelines for inpatient rooms apply. Pre-op nurse will coordinate an appropriate time for 1 support person to accompany patient in pre-op.  This support person may not rotate.    Please refer to the [facility] website for the visitor guidelines for Inpatients (after your surgery is over and you are in a regular room).    Your procedure is scheduled on: 11/10/23   Report to [facility] Main Entrance    Report to admitting at  5:15 AM   Call this number if you have problems the morning of surgery [phone]   Do not eat food :After Midnight.   After Midnight you may have the following liquids until 4:15 AM DAY OF SURGERY  Water Non-Citrus Juices (without pulp, NO RED-Apple, White grape, White cranberry) Black Coffee (NO MILK/CREAM OR CREAMERS, sugar ok)  Clear Tea (NO MILK/CREAM OR CREAMERS, sugar ok) regular and decaf                             Plain Jell-O (NO RED)                                           Fruit ices (not with fruit pulp, NO RED)                                     Popsicles (NO RED)                                                               Sports drinks like Gatorade (NO RED)               Oral Hygiene is also important to reduce your risk of infection.                                    Remember - BRUSH YOUR TEETH THE MORNING OF SURGERY WITH YOUR REGULAR  TOOTHPASTE  DENTURES WILL BE REMOVED PRIOR TO SURGERY PLEASE DO NOT APPLY "Poly grip" OR ADHESIVES!!!   Stop all vitamins and herbal supplements 7 days before surgery.   Take these medicines the morning of surgery with A SIP OF WATER: Percocet, Gabapentin              You may not have any metal on your body including jewelry, and body piercing  Do not wear lotions, powders, cologne, or deodorant              Men may shave face and neck.   Do not bring valuables to the hospital. [facility] IS NOT             RESPONSIBLE   FOR VALUABLES.   Contacts, glasses, dentures or bridgework may not be worn into surgery.  DO NOT BRING YOUR HOME MEDICATIONS TO THE HOSPITAL. PHARMACY WILL DISPENSE MEDICATIONS LISTED ON YOUR MEDICATION LIST TO YOU DURING YOUR ADMISSION IN THE HOSPITAL!    Patients discharged on the day of surgery will not be allowed to drive home.  Someone NEEDS to stay with you for the first 24 hours after anesthesia.              Please read over the following fact sheets you were given: IF YOU HAVE QUESTIONS ABOUT YOUR PRE-OP INSTRUCTIONS PLEASE CALL [name]    If you received a COVID test during your pre-op visit  it is requested that you wear a mask when out in public, stay away from anyone that may not be feeling well and notify your surgeon if you develop symptoms. If you test positive for Covid or have been in contact with anyone that has tested positive in the last 10 days please notify you surgeon.      Pre-operative 5 CHG Bath Instructions   You can play a key role in reducing the risk of infection after surgery. Your skin needs to be as free of germs as possible. You can reduce the number of germs on your skin by washing with CHG (chlorhexidine gluconate) soap before surgery. CHG is an antiseptic soap that kills germs and continues to kill germs even after washing.   DO NOT use if you have an allergy to chlorhexidine/CHG or antibacterial soaps.  If your skin becomes reddened or irritated, stop using the CHG and notify one of our RNs at [phone].   Please shower with the CHG soap starting 4 days before surgery using the following schedule:     Please keep in mind the following:  DO NOT shave, including legs and underarms, starting the day of your first shower.   You may shave your face at any point before/day of surgery.  Place clean sheets on your bed the day you start using CHG soap. Use a clean washcloth (not used since being washed) for each shower. DO NOT sleep with pets once you start using the CHG.   CHG Shower Instructions:  If you choose to wash your hair and private area, wash first with your normal shampoo/soap.  After you use shampoo/soap, rinse your hair and body thoroughly to remove shampoo/soap residue.  Turn the water OFF and apply about 3 tablespoons (45 ml) of CHG soap to a CLEAN washcloth.  Apply CHG soap ONLY FROM YOUR NECK DOWN TO YOUR TOES (washing for 3-5 minutes)  DO NOT use CHG soap on face, private areas, open wounds, or sores.  Pay special attention to the area where your surgery is being performed.  If you are having back surgery, having someone wash your back for you may be helpful. Wait 2 minutes after CHG soap is applied, then you may rinse off the CHG soap.  Pat dry with a clean towel  Put on clean clothes/pajamas   If you choose to wear lotion, please use ONLY the CHG-compatible lotions on the back of this paper.  Additional instructions for the day of surgery: DO NOT APPLY any lotions, deodorants, cologne, or perfumes.   Put on clean/comfortable clothes.  Brush your teeth.  Ask your nurse before applying any prescription medications to the skin.      CHG Compatible Lotions   Aveeno Moisturizing lotion  Cetaphil Moisturizing Cream  Cetaphil Moisturizing Lotion  Clairol Herbal Essence Moisturizing Lotion, Dry Skin  Clairol Herbal Essence Moisturizing Lotion, Extra Dry Skin   Clairol Herbal Essence Moisturizing Lotion, Normal Skin  Curel Age Defying Therapeutic Moisturizing Lotion with Alpha Hydroxy  Curel Extreme Care Body Lotion  Curel Soothing Hands Moisturizing Hand Lotion  Curel Therapeutic Moisturizing Cream, Fragrance-Free  Curel Therapeutic Moisturizing Lotion, Fragrance-Free  Curel Therapeutic Moisturizing Lotion, Original Formula  Eucerin Daily Replenishing Lotion  Eucerin Dry Skin Therapy Plus Alpha Hydroxy Crme  Eucerin Dry Skin Therapy Plus Alpha Hydroxy Lotion  Eucerin Original Crme  Eucerin Original Lotion  Eucerin Plus Crme Eucerin Plus Lotion  Eucerin TriLipid Replenishing Lotion  Keri Anti-Bacterial Hand Lotion  Keri Deep Conditioning Original Lotion Dry Skin Formula Softly Scented  Keri Deep Conditioning Original Lotion, Fragrance Free Sensitive Skin Formula  Keri Lotion Fast Absorbing Fragrance Free Sensitive Skin Formula  Keri Lotion Fast Absorbing Softly Scented Dry Skin Formula  Keri Original Lotion  Keri Skin Renewal Lotion Keri Silky Smooth Lotion  Keri Silky Smooth Sensitive Skin Lotion  Nivea Body Creamy Conditioning Oil  Nivea Body Extra Enriched Lotion  Nivea Body Original Lotion  Nivea Body Sheer Moisturizing Lotion Nivea Crme  Nivea Skin Firming Lotion  NutraDerm 30 Skin Lotion  NutraDerm Skin Lotion  NutraDerm Therapeutic Skin Cream  NutraDerm Therapeutic Skin Lotion  ProShield Protective Hand Cream   Incentive Spirometer (Watch this video at home: [email or web address])  An incentive spirometer is a tool that can help keep your lungs clear and active. This tool measures how well you are filling your lungs with each breath. Taking long deep breaths may help reverse or decrease the chance of developing breathing (pulmonary) problems (especially infection) following: A long period of time when you are unable to move or be active. BEFORE THE PROCEDURE  If the spirometer includes an indicator to  show your best effort, your nurse or respiratory therapist will set it to a desired goal. If possible, sit up straight or lean slightly forward. Try not to slouch. Hold the incentive spirometer in an upright position. INSTRUCTIONS FOR USE  Sit on the edge of your bed if possible, or sit up as far as you can in bed or on a chair. Hold the incentive spirometer in an upright position. Breathe out normally. Place the mouthpiece in your mouth and seal your lips tightly around it. Breathe in slowly and as deeply as possible, raising the piston or the ball toward the top of the column. Hold your breath for 3-5 seconds or for as long as possible. Allow the piston or ball to fall to the bottom of the column. Remove the mouthpiece from your mouth and breathe out normally. Rest for a few seconds and repeat Steps 1 through 7 at least 10 times every 1-2 hours when you are awake. Take your time and take a few normal breaths between deep breaths. The spirometer may include an indicator to show your best effort. Use the indicator as a goal to work toward during each repetition. After each set of 10 deep breaths, practice coughing to be sure your lungs are clear. If you have an incision (the cut made at  the time of surgery), support your incision when coughing by placing a pillow or rolled up towels firmly against it. Once you are able to get out of bed, walk around indoors and cough well. You may stop using the incentive spirometer when instructed by your caregiver.  RISKS AND COMPLICATIONS Take your time so you do not get dizzy or light-headed. If you are in pain, you may need to take or ask for pain medication before doing incentive spirometry. It is harder to take a deep breath if you are having pain. AFTER USE Rest and breathe slowly and easily. It can be helpful to keep track of a log of your progress. Your caregiver can provide you with a simple table to help with this. If you are using the spirometer at  home, follow these instructions: SEEK MEDICAL CARE IF:  You are having difficultly using the spirometer. You have trouble using the spirometer as often as instructed. Your pain medication is not giving enough relief while using the spirometer. You develop fever of 100.5 F (38.1 C) or higher. SEEK IMMEDIATE MEDICAL CARE IF:  You cough up bloody sputum that had not been present before. You develop fever of 102 F (38.9 C) or greater. You develop worsening pain at or near the incision site. MAKE SURE YOU:  Understand these instructions. Will watch your condition. Will get help right away if you are not doing well or get worse.   WHAT IS A BLOOD TRANSFUSION? Blood Transfusion Information  A transfusion is the replacement of blood or some of its parts. Blood is made up of multiple cells which provide different functions. Red blood cells carry oxygen and are used for blood loss replacement. White blood cells fight against infection. Platelets control bleeding. Plasma helps clot blood. Other blood products are available for specialized needs, such as hemophilia or other clotting disorders. BEFORE THE TRANSFUSION  Who gives blood for transfusions?  Healthy volunteers who are fully evaluated to make sure their blood is safe. This is blood bank blood. Transfusion therapy is the safest it has ever been in the practice of medicine. Before blood is taken from a donor, a complete history is taken to make sure that person has no history of diseases nor engages in risky social behavior (examples are intravenous drug use or sexual activity with multiple partners). The donor's travel history is screened to minimize risk of transmitting infections, such as malaria. The donated blood is tested for signs of infectious diseases, such as HIV and hepatitis. The blood is then tested to be sure it is compatible with you in order to minimize the chance of a transfusion reaction. If you or a relative donates  blood, this is often done in anticipation of surgery and is not appropriate for emergency situations. It takes many days to process the donated blood. RISKS AND COMPLICATIONS Although transfusion therapy is very safe and saves many lives, the main dangers of transfusion include:  Getting an infectious disease. Developing a transfusion reaction. This is an allergic reaction to something in the blood you were given. Every precaution is taken to prevent this. The decision to have a blood transfusion has been considered carefully by your caregiver before blood is given. Blood is not given unless the benefits outweigh the risks. AFTER THE TRANSFUSION Right after receiving a blood transfusion, you will usually feel much better and more energetic. This is especially true if your red blood cells have gotten low (anemic). The transfusion raises the level of the  red blood cells which carry oxygen, and this usually causes an energy increase. The nurse administering the transfusion will monitor you carefully for complications. HOME CARE INSTRUCTIONS  No special instructions are needed after a transfusion. You may find your energy is better. Speak with your caregiver about any limitations on activity for underlying diseases you may have. SEEK MEDICAL CARE IF:  Your condition is not improving after your transfusion. You develop redness or irritation at the intravenous (IV) site. SEEK IMMEDIATE MEDICAL CARE IF:  Any of the following symptoms occur over the next 12 hours: Shaking chills. You have a temperature by mouth above 102 F (38.9 C), not controlled by medicine. Chest, back, or muscle pain. People around you feel you are not acting correctly or are confused. Shortness of breath or difficulty breathing. Dizziness and fainting. You get a rash or develop hives. You have a decrease in urine output. Your urine turns a dark color or changes to pink, red, or brown. Any of the following symptoms occur over  the next 10 days: You have a temperature by mouth above 102 F (38.9 C), not controlled by medicine. Shortness of breath. Weakness after normal activity. The white part of the eye turns yellow (jaundice). You have a decrease in the amount of urine or are urinating less often. Your urine turns a dark color or changes to pink, red, or brown. Document Released: 08/30/2000 Document Revised: 11/25/2011 Document Reviewed: 04/18/2008 [facility] Patient Information 2014 [place], [place].

## 2023-11-04 NOTE — Progress Notes (Signed)
COVID Vaccine received:  []  No []  Yes Date of any COVID positive Test in last 90 days:  PCP - Linus Galas NP Cardiologist -   Chest x-ray -  EKG -   Stress Test -  ECHO -  Cardiac Cath -   Bowel Prep - []  No  []   Yes ______  Pacemaker / ICD device []  No []  Yes   Spinal Cord Stimulator:[]  No []  Yes       History of Sleep Apnea? []  No []  Yes   CPAP used?- []  No []  Yes    Does the patient monitor blood sugar?          []  No []  Yes  []  N/A  Patient has: []  NO Hx DM   []  Pre-DM                 []  DM1  []   DM2 Does patient have a [employer] or Dexacom? []  No []  Yes   Fasting Blood Sugar Ranges-  Checks Blood Sugar _____ times a day  GLP1 agonist / usual dose -  GLP1 instructions:  SGLT-2 inhibitors / usual dose -  SGLT-2 instructions:   Blood Thinner / Instructions: Aspirin Instructions:  Comments:   Activity level: Patient is able / unable to climb a flight of stairs without difficulty; []  No CP  []  No SOB, but would have ___   Patient can / can not perform ADLs without assistance.   Anesthesia review:   Patient denies shortness of breath, fever, cough and chest pain at PAT appointment.  Patient verbalized understanding and agreement to the Pre-Surgical Instructions that were given to them at this PAT appointment. Patient was also educated of the need to review these PAT instructions again prior to his/her surgery.I reviewed the appropriate phone numbers to call if they have any and questions or concerns.

## 2023-11-04 NOTE — Progress Notes (Addendum)
Completed interview over the phone. Labs will need to be done DOS.  COVID Vaccine Completed: yes  Date of COVID positive in last 90 days: n/a  PCP - Linus Galas, NP Cardiologist - n/a  Chest x-ray - need DOS EKG - yes with PCP Stress Test - n/a ECHO - n/a Cardiac Cath - n/a Pacemaker/ICD device last checked: n/a Spinal Cord Stimulator: yes, not active. Haven't used in 2 years   Bowel Prep - no  Sleep Study - n/a CPAP -   Fasting Blood Sugar - n/a Checks Blood Sugar _____ times a day  Last dose of GLP1 agonist-  N/A GLP1 instructions:  Hold 7 days before surgery    Last dose of SGLT-2 inhibitors-  N/A SGLT-2 instructions:  Hold 3 days before surgery    Blood Thinner Instructions:  Last dose: n/a  Time: Aspirin Instructions: Last Dose:  Activity level: Can go up a flight of stairs and perform activities of daily living without stopping and without symptoms of chest pain or shortness of breath.  Anesthesia review:   Patient denies shortness of breath, fever, cough and chest pain at PAT appointment  Patient verbalized understanding of instructions that were given to them at the PAT appointment. Patient was also instructed that they will need to review over the PAT instructions again at home before surgery.

## 2023-11-04 NOTE — Progress Notes (Signed)
PCP - Linus Galas NP Cardiologist -    Chest x-ray -  EKG -   Stress Test -  ECHO -  Cardiac Cath -    Bowel Prep - []  No  []   Yes ______   Pacemaker / ICD device []  No []  Yes   Spinal Cord Stimulator:[]  No []  Yes       History of Sleep Apnea? []  No []  Yes   CPAP used?- []  No []  Yes     Does the patient monitor blood sugar?          []  No []  Yes  []  N/A   Patient has: []  NO Hx DM   []  Pre-DM                 []  DM1  []   DM2 Does patient have a [employer] or Dexacom? []  No []  Yes   Fasting Blood Sugar Ranges-  Checks Blood Sugar _____ times a day   GLP1 agonist / usual dose -  GLP1 instructions:  SGLT-2 inhibitors / usual dose -  SGLT-2 instructions:    Blood Thinner / Instructions: Aspirin Instructions:   Comments:    Activity level: Patient is able / unable to climb a flight of stairs without difficulty; []  No CP  []  No SOB, but would have ___   Patient can / can not perform ADLs without assistance.    Anesthesia review:    Patient denies shortness of breath, fever, cough and chest pain at PAT appointment.   Patient verbalized understanding and agreement to the Pre-Surgical Instructions that were given to them at this PAT appointment. Patient was also educated of the need to review these PAT instructions again prior to his/her surgery.I reviewed the appropriate phone numbers to call if they have any and questions or concerns.

## 2023-11-05 NOTE — Patient Instructions (Signed)
SURGICAL WAITING ROOM VISITATION  Patients having surgery or a procedure may have no more than 2 support people in the waiting area - these visitors may rotate.    Children under the age of 52 must have an adult with them who is not the patient.  Due to an increase in RSV and influenza rates and associated hospitalizations, children ages 21 and under may not visit patients in [facility] hospitals.  Visitors with respiratory illnesses are discouraged from visiting and should remain at home.  If the patient needs to stay at the hospital during part of their recovery, the visitor guidelines for inpatient rooms apply. Pre-op nurse will coordinate an appropriate time for 1 support person to accompany patient in pre-op.  This support person may not rotate.    Please refer to the [facility] website for the visitor guidelines for Inpatients (after your surgery is over and you are in a regular room).    Your procedure is scheduled on: 11/10/23   Report to [facility] Main Entrance    Report to admitting at 5:15 AM   Call this number if you have problems the morning of surgery [phone]   Do not eat food :After Midnight.   After Midnight you may have the following liquids until 4:15 AM DAY OF SURGERY  Water Non-Citrus Juices (without pulp, NO RED-Apple, White grape, White cranberry) Black Coffee (NO MILK/CREAM OR CREAMERS, sugar ok)  Clear Tea (NO MILK/CREAM OR CREAMERS, sugar ok) regular and decaf                             Plain Jell-O (NO RED)                                           Fruit ices (not with fruit pulp, NO RED)                                     Popsicles (NO RED)                                                               Sports drinks like Gatorade (NO RED)                      If you have questions, please contact your surgeon's office.   FOLLOW BOWEL PREP AND ANY ADDITIONAL PRE OP INSTRUCTIONS YOU RECEIVED FROM YOUR SURGEON'S OFFICE!!!     Oral  Hygiene is also important to reduce your risk of infection.                                    Remember - BRUSH YOUR TEETH THE MORNING OF SURGERY WITH YOUR REGULAR TOOTHPASTE  DENTURES WILL BE REMOVED PRIOR TO SURGERY PLEASE DO NOT APPLY "Poly grip" OR ADHESIVES!!!   Do NOT smoke after Midnight   Stop all vitamins and herbal supplements 7 days before surgery.   Take these medicines the morning of  surgery with A SIP OF WATER: Gabapentin, Percocet                               You may not have any metal on your body including jewelry, and body piercing             Do not wear make-up, lotions, powders, perfumes/cologne, or deodorant              Men may shave face and neck.   Do not bring valuables to the hospital. [facility] IS NOT             RESPONSIBLE   FOR VALUABLES.   Contacts, glasses, dentures or bridgework may not be worn into surgery.  DO NOT BRING YOUR HOME MEDICATIONS TO THE HOSPITAL. PHARMACY WILL DISPENSE MEDICATIONS LISTED ON YOUR MEDICATION LIST TO YOU DURING YOUR ADMISSION IN THE HOSPITAL!    Patients discharged on the day of surgery will not be allowed to drive home.  Someone NEEDS to stay with you for the first 24 hours after anesthesia.              Please read over the following fact sheets you were given: IF YOU HAVE QUESTIONS ABOUT YOUR PRE-OP INSTRUCTIONS PLEASE CALL [name]    If you received a COVID test during your pre-op visit  it is requested that you wear a mask when out in public, stay away from anyone that may not be feeling well and notify your surgeon if you develop symptoms. If you test positive for Covid or have been in contact with anyone that has tested positive in the last 10 days please notify you surgeon.      Pre-operative 5 CHG Bath Instructions   You can play a key role in reducing the risk of infection after surgery. Your skin needs to be as free of germs as possible. You can reduce the number of germs on your skin by  washing with CHG (chlorhexidine gluconate) soap before surgery. CHG is an antiseptic soap that kills germs and continues to kill germs even after washing.   DO NOT use if you have an allergy to chlorhexidine/CHG or antibacterial soaps. If your skin becomes reddened or irritated, stop using the CHG and notify one of our RNs at [phone].   Please shower with the CHG soap starting 4 days before surgery using the following schedule:     Please keep in mind the following:  DO NOT shave, including legs and underarms, starting the day of your first shower.   You may shave your face at any point before/day of surgery.  Place clean sheets on your bed the day you start using CHG soap. Use a clean washcloth (not used since being washed) for each shower. DO NOT sleep with pets once you start using the CHG.   CHG Shower Instructions:  If you choose to wash your hair and private area, wash first with your normal shampoo/soap.  After you use shampoo/soap, rinse your hair and body thoroughly to remove shampoo/soap residue.  Turn the water OFF and apply about 3 tablespoons (45 ml) of CHG soap to a CLEAN washcloth.  Apply CHG soap ONLY FROM YOUR NECK DOWN TO YOUR TOES (washing for 3-5 minutes)  DO NOT use CHG soap on face, private areas, open wounds, or sores.  Pay special attention to the area where your surgery is being performed.  If you are  having back surgery, having someone wash your back for you may be helpful. Wait 2 minutes after CHG soap is applied, then you may rinse off the CHG soap.  Pat dry with a clean towel  Put on clean clothes/pajamas   If you choose to wear lotion, please use ONLY the CHG-compatible lotions on the back of this paper.     Additional instructions for the day of surgery: DO NOT APPLY any lotions, deodorants, cologne, or perfumes.   Put on clean/comfortable clothes.  Brush your teeth.  Ask your nurse before applying any prescription medications to the  skin.      CHG Compatible Lotions   Aveeno Moisturizing lotion  Cetaphil Moisturizing Cream  Cetaphil Moisturizing Lotion  Clairol Herbal Essence Moisturizing Lotion, Dry Skin  Clairol Herbal Essence Moisturizing Lotion, Extra Dry Skin  Clairol Herbal Essence Moisturizing Lotion, Normal Skin  Curel Age Defying Therapeutic Moisturizing Lotion with Alpha Hydroxy  Curel Extreme Care Body Lotion  Curel Soothing Hands Moisturizing Hand Lotion  Curel Therapeutic Moisturizing Cream, Fragrance-Free  Curel Therapeutic Moisturizing Lotion, Fragrance-Free  Curel Therapeutic Moisturizing Lotion, Original Formula  Eucerin Daily Replenishing Lotion  Eucerin Dry Skin Therapy Plus Alpha Hydroxy Crme  Eucerin Dry Skin Therapy Plus Alpha Hydroxy Lotion  Eucerin Original Crme  Eucerin Original Lotion  Eucerin Plus Crme Eucerin Plus Lotion  Eucerin TriLipid Replenishing Lotion  Keri Anti-Bacterial Hand Lotion  Keri Deep Conditioning Original Lotion Dry Skin Formula Softly Scented  Keri Deep Conditioning Original Lotion, Fragrance Free Sensitive Skin Formula  Keri Lotion Fast Absorbing Fragrance Free Sensitive Skin Formula  Keri Lotion Fast Absorbing Softly Scented Dry Skin Formula  Keri Original Lotion  Keri Skin Renewal Lotion Keri Silky Smooth Lotion  Keri Silky Smooth Sensitive Skin Lotion  Nivea Body Creamy Conditioning Oil  Nivea Body Extra Enriched Lotion  Nivea Body Original Lotion  Nivea Body Sheer Moisturizing Lotion Nivea Crme  Nivea Skin Firming Lotion  NutraDerm 30 Skin Lotion  NutraDerm Skin Lotion  NutraDerm Therapeutic Skin Cream  NutraDerm Therapeutic Skin Lotion  ProShield Protective Hand Cream  Provon moisturizing lotion   Incentive Spirometer  An incentive spirometer is a tool that can help keep your lungs clear and active. This tool measures how well you are filling your lungs with each breath. Taking long deep breaths may help reverse or decrease the chance of  developing breathing (pulmonary) problems (especially infection) following: A long period of time when you are unable to move or be active. BEFORE THE PROCEDURE  If the spirometer includes an indicator to show your best effort, your nurse or respiratory therapist will set it to a desired goal. If possible, sit up straight or lean slightly forward. Try not to slouch. Hold the incentive spirometer in an upright position. INSTRUCTIONS FOR USE  Sit on the edge of your bed if possible, or sit up as far as you can in bed or on a chair. Hold the incentive spirometer in an upright position. Breathe out normally. Place the mouthpiece in your mouth and seal your lips tightly around it. Breathe in slowly and as deeply as possible, raising the piston or the ball toward the top of the column. Hold your breath for 3-5 seconds or for as long as possible. Allow the piston or ball to fall to the bottom of the column. Remove the mouthpiece from your mouth and breathe out normally. Rest for a few seconds and repeat Steps 1 through 7 at least 10 times every 1-2 hours when  you are awake. Take your time and take a few normal breaths between deep breaths. The spirometer may include an indicator to show your best effort. Use the indicator as a goal to work toward during each repetition. After each set of 10 deep breaths, practice coughing to be sure your lungs are clear. If you have an incision (the cut made at the time of surgery), support your incision when coughing by placing a pillow or rolled up towels firmly against it. Once you are able to get out of bed, walk around indoors and cough well. You may stop using the incentive spirometer when instructed by your caregiver.  RISKS AND COMPLICATIONS Take your time so you do not get dizzy or light-headed. If you are in pain, you may need to take or ask for pain medication before doing incentive spirometry. It is harder to take a deep breath if you are having pain. AFTER  USE Rest and breathe slowly and easily. It can be helpful to keep track of a log of your progress. Your caregiver can provide you with a simple table to help with this. If you are using the spirometer at home, follow these instructions: SEEK MEDICAL CARE IF:  You are having difficultly using the spirometer. You have trouble using the spirometer as often as instructed. Your pain medication is not giving enough relief while using the spirometer. You develop fever of 100.5 F (38.1 C) or higher. SEEK IMMEDIATE MEDICAL CARE IF:  You cough up bloody sputum that had not been present before. You develop fever of 102 F (38.9 C) or greater. You develop worsening pain at or near the incision site. MAKE SURE YOU:  Understand these instructions. Will watch your condition. Will get help right away if you are not doing well or get worse. Document Released: 01/13/2007 Document Revised: 11/25/2011 Document Reviewed: 03/16/2007 Exit[employer] Patient Information 2014 Exit[employer], [place].   ________________________________________________________________________ WHAT IS A BLOOD TRANSFUSION? Blood Transfusion Information  A transfusion is the replacement of blood or some of its parts. Blood is made up of multiple cells which provide different functions. Red blood cells carry oxygen and are used for blood loss replacement. White blood cells fight against infection. Platelets control bleeding. Plasma helps clot blood. Other blood products are available for specialized needs, such as hemophilia or other clotting disorders. BEFORE THE TRANSFUSION  Who gives blood for transfusions?  Healthy volunteers who are fully evaluated to make sure their blood is safe. This is blood bank blood. Transfusion therapy is the safest it has ever been in the practice of medicine. Before blood is taken from a donor, a complete history is taken to make sure that person has no history of diseases nor engages in risky social behavior  (examples are intravenous drug use or sexual activity with multiple partners). The donor's travel history is screened to minimize risk of transmitting infections, such as malaria. The donated blood is tested for signs of infectious diseases, such as HIV and hepatitis. The blood is then tested to be sure it is compatible with you in order to minimize the chance of a transfusion reaction. If you or a relative donates blood, this is often done in anticipation of surgery and is not appropriate for emergency situations. It takes many days to process the donated blood. RISKS AND COMPLICATIONS Although transfusion therapy is very safe and saves many lives, the main dangers of transfusion include:  Getting an infectious disease. Developing a transfusion reaction. This is an allergic reaction to something  in the blood you were given. Every precaution is taken to prevent this. The decision to have a blood transfusion has been considered carefully by your caregiver before blood is given. Blood is not given unless the benefits outweigh the risks. AFTER THE TRANSFUSION Right after receiving a blood transfusion, you will usually feel much better and more energetic. This is especially true if your red blood cells have gotten low (anemic). The transfusion raises the level of the red blood cells which carry oxygen, and this usually causes an energy increase. The nurse administering the transfusion will monitor you carefully for complications. HOME CARE INSTRUCTIONS  No special instructions are needed after a transfusion. You may find your energy is better. Speak with your caregiver about any limitations on activity for underlying diseases you may have. SEEK MEDICAL CARE IF:  Your condition is not improving after your transfusion. You develop redness or irritation at the intravenous (IV) site. SEEK IMMEDIATE MEDICAL CARE IF:  Any of the following symptoms occur over the next 12 hours: Shaking chills. You have a  temperature by mouth above 102 F (38.9 C), not controlled by medicine. Chest, back, or muscle pain. People around you feel you are not acting correctly or are confused. Shortness of breath or difficulty breathing. Dizziness and fainting. You get a rash or develop hives. You have a decrease in urine output. Your urine turns a dark color or changes to pink, red, or brown. Any of the following symptoms occur over the next 10 days: You have a temperature by mouth above 102 F (38.9 C), not controlled by medicine. Shortness of breath. Weakness after normal activity. The white part of the eye turns yellow (jaundice). You have a decrease in the amount of urine or are urinating less often. Your urine turns a dark color or changes to pink, red, or brown. Document Released: 08/30/2000 Document Revised: 11/25/2011 Document Reviewed: 04/18/2008 [facility] Patient Information 2014 [place], [place].  _______________________________________________________________________

## 2023-11-05 NOTE — Progress Notes (Signed)
Instructions emailed to patient

## 2023-11-06 ENCOUNTER — Encounter (HOSPITAL_COMMUNITY)
Admission: RE | Admit: 2023-11-06 | Discharge: 2023-11-06 | Disposition: A | Discharge status: Home / Self Care | Payer: PPO | Source: Ambulatory Visit

## 2023-11-06 VITALS — Ht 69.0 in | Wt 185.0 lb

## 2023-11-06 DIAGNOSIS — Z01818 Encounter for other preprocedural examination: Secondary | ICD-10-CM

## 2023-11-09 DIAGNOSIS — M1612 Unilateral primary osteoarthritis, left hip: Secondary | ICD-10-CM | POA: Diagnosis present

## 2023-11-09 NOTE — H&P (Signed)
 TOTAL HIP ADMISSION H&P  Patient is admitted for left total hip arthroplasty.  Subjective:  Chief Complaint: left hip pain  HPI: Brett Hernandez, 68 y.o. male, has a history of pain and functional disability in the left hip(s) due to arthritis and patient has failed non-surgical conservative treatments for greater than 12 weeks to include NSAID's and/or analgesics, flexibility and strengthening excercises, supervised PT with diminished ADL's post treatment, weight reduction as appropriate, and activity modification.  Onset of symptoms was gradual starting 3 years ago with gradually worsening course since that time.The patient noted no past surgery on the left hip(s).  Patient currently rates pain in the left hip at 8 out of 10 with activity. Patient has night pain, worsening of pain with activity and weight bearing, trendelenberg gait, pain that interfers with activities of daily living, and pain with passive range of motion. Patient has evidence of subchondral cysts, subchondral sclerosis, and joint space narrowing by imaging studies. This condition presents safety issues increasing the risk of falls..  There is no current active infection.  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 01/30/2018   S/P insertion of spinal cord stimulator 01/09/2017   Neck pain 01/09/2017   Past Medical History:  Diagnosis Date   Arthritis    Bilateral hip pain    Chronic pain    cervical, lumbar   Headache    Low back pain    Scoliosis    Spinal stenosis     Past Surgical History:  Procedure Laterality Date   ABDOMINAL EXPOSURE N/A 11/27/2022    Procedure: ABDOMINAL EXPOSURE;  Surgeon: Cephus Shelling, MD;  Location[facility] OR;  Service: Vascular;  Laterality: N/A;   ANTERIOR CERVICAL DECOMP/DISCECTOMY FUSION N/A 12/06/2021   Procedure: ANTERIOR CERVICAL DECOMPRESSION AND FUSION CERVICAL FIVE- CERVICAL SIX WITH INSTRUMENTATION AND ALLOGRAFT;  Surgeon: Estill Bamberg, MD;  Location: [facility] OR;  Service: Orthopedics;  Laterality: N/A;   ANTERIOR CRUCIATE LIGAMENT REPAIR     ANTERIOR LAT LUMBAR FUSION Left 11/27/2022   Procedure: LEFT-SIDED LUMBAR 1- LUMBAR 2, LUMBAR 2- LUMBAR 3, LUMBAR 3- LUMBAR 4 LATERAL INTERBODY FUSION WITH INSTRUMENTATION AND ALLOGRAFT.  LUMBAR 4- LUMBAR 5, LUMBAR 5- SACRUM 1 ANTERIOR LUMBAR INTERBODY FUSION WITH INSTRUMENTATION AND ALLOGRAFT;  Surgeon: Estill Bamberg, MD;  Location: [facility] OR;  Service: Orthopedics;  Laterality: Left;   CARPAL TUNNEL RELEASE Bilateral    INGUINAL HERNIA REPAIR Bilateral    POSTERIOR LUMBAR FUSION 4 LEVEL N/A 11/28/2022   Procedure: LUMBAR 1- LUMBAR 2, LUMBAR 2- LUMBAR 3, LUMBAR 3- LUMBAR 4, LUMBAR 4- LUMBAR 5, LUMBAR 5- SACRUM 1 POSTERIOR DECOMPRESSION AND FUSION WITH INSTRUMENTATION AND ALLOGRAFT;  Surgeon: Estill Bamberg, MD;  Location: [facility] OR;  Service: Orthopedics;  Laterality: N/A;   ROTATOR CUFF REPAIR     SPINAL CORD STIMULATOR IMPLANT  2009   in neck   TONSILLECTOMY     removed as a child   VASECTOMY      No current facility-administered medications for this encounter.   Current Outpatient Medications  Medication Sig Dispense Refill Last Dose/Taking   Fluocinolone Acetonide 0.01 % OIL Place 4 drops into both ears daily as needed (  itching).   Taking As Needed   fluticasone (FLONASE) 50 MCG/ACT nasal spray Place 1 spray into both nostrils at bedtime as needed for allergies.   Taking As Needed   gabapentin (NEURONTIN) 300 MG capsule Take 300 mg by mouth 3 (three) times daily.   Taking   guaifenesin (HUMIBID E) 400 MG TABS tablet Take 400 mg by mouth every 4 (four) hours as needed  (congestion).   Taking As Needed   meloxicam (MOBIC) 15 MG tablet Take 15 mg by mouth daily.   Taking   oxyCODONE-acetaminophen (PERCOCET) 7.5-325 MG tablet Take 1 tablet by mouth every 6 (six) hours as needed.   Taking As Needed   oxymetazoline (AFRIN) 0.05 % nasal spray Place 1 spray into both nostrils at bedtime.   Taking   prednisoLONE acetate (PRED FORTE) 1 % ophthalmic suspension Place 1 drop into the right eye 3 (three) times daily.   Taking   traZODone (DESYREL) 50 MG tablet Take 125 mg by mouth at bedtime.   Taking   Allergies  Allergen Reactions   Grass Extracts [Gramineae Pollens]     HAY FEVER    Social History   Tobacco Use   Smoking status: Never   Smokeless tobacco: Never  Substance Use Topics   Alcohol use: Not Currently    No family history on file.   Review of Systems Negative as it relates to the HPI.  Objective:  Physical Exam Well-developed well-nourished patient in no acute distress. Alert and oriented x3 HEENT:within normal limits Cardiac: Regular rate and rhythm Pulmonary: Lungs clear to auscultation Abdomen: Soft and nontender.  Normal active bowel sounds  Musculoskeletal: Left hip exam: He has limited internal and external rotation of the hip with pain.  He ambulates with an antalgic gait secondary left hip pain.   Vital signs in last 24 hours:    Labs: No results found for this or any previous visit (from the past 2160 hours).   Estimated body mass index is 27.32 kg/m as calculated from the following:   Height as of 11/04/23: 5\' 9"  (1.753 m).   Weight as of 11/04/23: 83.9 kg.   Imaging Review Plain radiographs demonstrate severe degenerative joint disease of the left hip(s). The bone quality appears to be good for age and reported activity level.      Assessment/Plan:  End stage arthritis, left hip(s)  The patient history, physical examination, clinical judgement of the provider and imaging studies are consistent with end stage  degenerative joint disease of the left hip(s) and total hip arthroplasty is deemed medically necessary. The treatment options including medical management, injection therapy, arthroscopy and arthroplasty were discussed at length. The risks and benefits of total hip arthroplasty were presented and reviewed. The risks due to aseptic loosening, infection, stiffness, dislocation/subluxation,  thromboembolic complications and other imponderables were discussed.  The patient acknowledged the explanation, agreed to proceed with the plan and consent was signed. Patient is being admitted for inpatient treatment for surgery, pain control, PT, OT, prophylactic antibiotics, VTE prophylaxis, progressive ambulation and ADL's and discharge planning.The patient is planning to be discharged  To home as an outpatient.

## 2023-11-10 ENCOUNTER — Ambulatory Visit (HOSPITAL_COMMUNITY): Payer: PPO | Admitting: Anesthesiology

## 2023-11-10 ENCOUNTER — Ambulatory Visit (HOSPITAL_COMMUNITY)
Admission: RE | Admit: 2023-11-10 | Discharge: 2023-11-10 | Disposition: A | Discharge status: Home / Self Care | Payer: PPO | Attending: Orthopedic Surgery | Admitting: Orthopedic Surgery

## 2023-11-10 ENCOUNTER — Other Ambulatory Visit: Payer: Self-pay

## 2023-11-10 ENCOUNTER — Encounter (HOSPITAL_COMMUNITY)
Admission: RE | Disposition: A | Discharge status: Home / Self Care | Payer: Self-pay | Source: Home / Self Care | Attending: Orthopedic Surgery

## 2023-11-10 ENCOUNTER — Encounter (HOSPITAL_COMMUNITY): Payer: Self-pay | Admitting: Orthopedic Surgery

## 2023-11-10 ENCOUNTER — Ambulatory Visit (HOSPITAL_COMMUNITY): Payer: PPO

## 2023-11-10 DIAGNOSIS — Z791 Long term (current) use of non-steroidal anti-inflammatories (NSAID): Secondary | ICD-10-CM | POA: Insufficient documentation

## 2023-11-10 DIAGNOSIS — M5416 Radiculopathy, lumbar region: Secondary | ICD-10-CM | POA: Insufficient documentation

## 2023-11-10 DIAGNOSIS — M1612 Unilateral primary osteoarthritis, left hip: Secondary | ICD-10-CM | POA: Diagnosis not present

## 2023-11-10 DIAGNOSIS — M47812 Spondylosis without myelopathy or radiculopathy, cervical region: Secondary | ICD-10-CM | POA: Diagnosis not present

## 2023-11-10 DIAGNOSIS — G894 Chronic pain syndrome: Secondary | ICD-10-CM | POA: Diagnosis not present

## 2023-11-10 DIAGNOSIS — Z01818 Encounter for other preprocedural examination: Secondary | ICD-10-CM

## 2023-11-10 DIAGNOSIS — Z79899 Other long term (current) drug therapy: Secondary | ICD-10-CM | POA: Insufficient documentation

## 2023-11-10 DIAGNOSIS — R519 Headache, unspecified: Secondary | ICD-10-CM | POA: Insufficient documentation

## 2023-11-10 DIAGNOSIS — M419 Scoliosis, unspecified: Secondary | ICD-10-CM | POA: Insufficient documentation

## 2023-11-10 DIAGNOSIS — Z96642 Presence of left artificial hip joint: Secondary | ICD-10-CM | POA: Diagnosis not present

## 2023-11-10 HISTORY — PX: TOTAL HIP ARTHROPLASTY: SHX124

## 2023-11-10 LAB — BASIC METABOLIC PANEL
Anion gap: 9 (ref 5–15)
BUN: 16 mg/dL (ref 8–23)
CO2: 27 mmol/L (ref 22–32)
Calcium: 9.2 mg/dL (ref 8.9–10.3)
Chloride: 102 mmol/L (ref 98–111)
Creatinine, Ser: 0.98 mg/dL (ref 0.61–1.24)
GFR, Estimated: >60 mL/min (ref >60)
Glucose, Bld: 90 mg/dL (ref 70–99)
Potassium: 3.9 mmol/L (ref 3.5–5.1)
Sodium: 138 mmol/L (ref 135–145)

## 2023-11-10 LAB — TYPE AND SCREEN
ABO/RH(D): B POS
Antibody Screen: NEGATIVE

## 2023-11-10 LAB — CBC
HCT: 45.9 % (ref 39.0–52.0)
Hemoglobin: 14.5 g/dL (ref 13.0–17.0)
MCH: 28.1 pg (ref 26.0–34.0)
MCHC: 31.6 g/dL (ref 30.0–36.0)
MCV: 89 fL (ref 80.0–100.0)
Platelets: 305 10*3/uL (ref 150–400)
RBC: 5.16 MIL/uL (ref 4.22–5.81)
RDW: 13.5 % (ref 11.5–15.5)
WBC: 6.6 10*3/uL (ref 4.0–10.5)
nRBC: 0 % (ref 0.0–0.2)

## 2023-11-10 LAB — SURGICAL PCR SCREEN
MRSA, PCR: NEGATIVE
Staphylococcus aureus: NEGATIVE

## 2023-11-10 SURGERY — ARTHROPLASTY, HIP, TOTAL, ANTERIOR APPROACH
Anesthesia: Spinal | Site: Hip | Laterality: Left

## 2023-11-10 MED ORDER — LACTATED RINGERS IV SOLN: INTRAVENOUS | Status: DC

## 2023-11-10 MED ORDER — ORAL CARE MOUTH RINSE: 15.0000 mL | Freq: Once | OROMUCOSAL | Status: AC

## 2023-11-10 MED ORDER — LIDOCAINE HCL (PF) 2 % IJ SOLN
INTRAMUSCULAR | Status: AC
  Filled 2023-11-10: qty 5

## 2023-11-10 MED ORDER — TRANEXAMIC ACID-NACL 1000-0.7 MG/100ML-% IV SOLN
INTRAVENOUS | Status: AC
  Filled 2023-11-10: qty 100

## 2023-11-10 MED ORDER — ACETAMINOPHEN 10 MG/ML IV SOLN
INTRAVENOUS | Status: AC
  Administered 2023-11-10: 1000 mg
  Filled 2023-11-10: qty 100

## 2023-11-10 MED ORDER — FENTANYL CITRATE PF 50 MCG/ML IJ SOSY
PREFILLED_SYRINGE | INTRAMUSCULAR | Status: AC
  Administered 2023-11-10: 50 ug via INTRAVENOUS
  Filled 2023-11-10: qty 1

## 2023-11-10 MED ORDER — FENTANYL CITRATE PF 50 MCG/ML IJ SOSY
PREFILLED_SYRINGE | INTRAMUSCULAR | Status: AC
  Administered 2023-11-10: 50 ug via INTRAVENOUS
  Filled 2023-11-10: qty 2

## 2023-11-10 MED ORDER — TRANEXAMIC ACID-NACL 1000-0.7 MG/100ML-% IV SOLN
1000.0000 mg | Freq: Once | INTRAVENOUS | Status: AC
  Administered 2023-11-10: 1000 mg via INTRAVENOUS

## 2023-11-10 MED ORDER — METHOCARBAMOL 500 MG PO TABS
ORAL_TABLET | ORAL | Status: AC
  Administered 2023-11-10: 500 mg via ORAL
  Filled 2023-11-10: qty 1

## 2023-11-10 MED ORDER — EPHEDRINE SULFATE-NACL 50-0.9 MG/10ML-% IV SOSY
PREFILLED_SYRINGE | INTRAVENOUS | Status: DC | PRN
Start: 2023-11-10 — End: 2023-11-10
  Administered 2023-11-10: 10 mg via INTRAVENOUS

## 2023-11-10 MED ORDER — OXYCODONE HCL 5 MG/5ML PO SOLN: 5.0000 mg | Freq: Once | ORAL | Status: DC | PRN

## 2023-11-10 MED ORDER — BUPIVACAINE LIPOSOME 1.3 % IJ SUSP
INTRAMUSCULAR | Status: DC | PRN
  Administered 2023-11-10: 10 mL

## 2023-11-10 MED ORDER — CELECOXIB 200 MG PO CAPS: 200.0000 mg | ORAL_CAPSULE | Freq: Two times a day (BID) | ORAL | 0 refills | Status: AC

## 2023-11-10 MED ORDER — PROPOFOL 10 MG/ML IV BOLUS
INTRAVENOUS | Status: DC | PRN
  Administered 2023-11-10: 10 mg via INTRAVENOUS
  Administered 2023-11-10: 20 mg via INTRAVENOUS
  Administered 2023-11-10: 10 mg via INTRAVENOUS
  Administered 2023-11-10: 30 mg via INTRAVENOUS
  Administered 2023-11-10: 20 mg via INTRAVENOUS
  Administered 2023-11-10: 10 mg via INTRAVENOUS

## 2023-11-10 MED ORDER — ACETAMINOPHEN 500 MG PO TABS
1000.0000 mg | ORAL_TABLET | Freq: Once | ORAL | Status: AC
  Administered 2023-11-10: 1000 mg via ORAL
  Filled 2023-11-10: qty 2

## 2023-11-10 MED ORDER — DEXAMETHASONE SODIUM PHOSPHATE 10 MG/ML IJ SOLN
INTRAMUSCULAR | Status: AC
  Filled 2023-11-10: qty 1

## 2023-11-10 MED ORDER — EPHEDRINE 5 MG/ML INJ
INTRAVENOUS | Status: AC
  Filled 2023-11-10: qty 5

## 2023-11-10 MED ORDER — CEFAZOLIN SODIUM-DEXTROSE 2-4 GM/100ML-% IV SOLN
2.0000 g | Freq: Four times a day (QID) | INTRAVENOUS | Status: DC
  Administered 2023-11-10: 2 g via INTRAVENOUS

## 2023-11-10 MED ORDER — ONDANSETRON HCL 4 MG/2ML IJ SOLN
INTRAMUSCULAR | Status: AC
  Filled 2023-11-10: qty 2

## 2023-11-10 MED ORDER — ACETAMINOPHEN 500 MG PO TABS: 1000.0000 mg | ORAL_TABLET | Freq: Four times a day (QID) | ORAL | Status: DC

## 2023-11-10 MED ORDER — OXYCODONE HCL 5 MG PO TABS
5.0000 mg | ORAL_TABLET | ORAL | Status: DC | PRN
  Administered 2023-11-10: 10 mg via ORAL

## 2023-11-10 MED ORDER — MIDAZOLAM HCL 5 MG/5ML IJ SOLN
INTRAMUSCULAR | Status: DC | PRN
  Administered 2023-11-10: 2 mg via INTRAVENOUS

## 2023-11-10 MED ORDER — LACTATED RINGERS IV BOLUS
500.0000 mL | Freq: Once | INTRAVENOUS | Status: AC
  Administered 2023-11-10: 500 mL via INTRAVENOUS

## 2023-11-10 MED ORDER — LACTATED RINGERS IV BOLUS
250.0000 mL | Freq: Once | INTRAVENOUS | Status: AC
  Administered 2023-11-10: 250 mL via INTRAVENOUS

## 2023-11-10 MED ORDER — MIDAZOLAM HCL 2 MG/2ML IJ SOLN
INTRAMUSCULAR | Status: AC
  Filled 2023-11-10: qty 2

## 2023-11-10 MED ORDER — OXYCODONE HCL 5 MG PO TABS
ORAL_TABLET | ORAL | Status: AC
  Filled 2023-11-10: qty 2

## 2023-11-10 MED ORDER — HYDROMORPHONE HCL 2 MG PO TABS: 2.0000 mg | ORAL_TABLET | ORAL | 0 refills | Status: AC | PRN

## 2023-11-10 MED ORDER — ONDANSETRON HCL 4 MG/2ML IJ SOLN: 4.0000 mg | Freq: Once | INTRAMUSCULAR | Status: DC | PRN

## 2023-11-10 MED ORDER — OXYCODONE HCL 5 MG PO TABS: 5.0000 mg | ORAL_TABLET | Freq: Once | ORAL | Status: DC | PRN

## 2023-11-10 MED ORDER — HYDROMORPHONE HCL 1 MG/ML IJ SOLN
INTRAMUSCULAR | Status: AC
  Filled 2023-11-10: qty 1

## 2023-11-10 MED ORDER — POVIDONE-IODINE 10 % EX SWAB
2.0000 | Freq: Once | CUTANEOUS | Status: AC
  Administered 2023-11-10: 2 via TOPICAL

## 2023-11-10 MED ORDER — BUPIVACAINE-EPINEPHRINE 0.25% -1:200000 IJ SOLN
INTRAMUSCULAR | Status: AC
  Filled 2023-11-10: qty 1

## 2023-11-10 MED ORDER — ONDANSETRON HCL 4 MG/2ML IJ SOLN: 4.0000 mg | Freq: Four times a day (QID) | INTRAMUSCULAR | Status: DC | PRN

## 2023-11-10 MED ORDER — TRANEXAMIC ACID-NACL 1000-0.7 MG/100ML-% IV SOLN
1000.0000 mg | INTRAVENOUS | Status: AC
  Administered 2023-11-10: 1000 mg via INTRAVENOUS
  Filled 2023-11-10: qty 100

## 2023-11-10 MED ORDER — TIZANIDINE HCL 2 MG PO TABS
2.0000 mg | ORAL_TABLET | Freq: Three times a day (TID) | ORAL | 0 refills | Status: AC | PRN
Start: 2023-11-10 — End: ?

## 2023-11-10 MED ORDER — DOCUSATE SODIUM 100 MG PO CAPS: 100.0000 mg | ORAL_CAPSULE | Freq: Two times a day (BID) | ORAL | 0 refills | Status: AC

## 2023-11-10 MED ORDER — CEFAZOLIN SODIUM-DEXTROSE 2-4 GM/100ML-% IV SOLN
INTRAVENOUS | Status: AC
  Filled 2023-11-10: qty 100

## 2023-11-10 MED ORDER — PROPOFOL 500 MG/50ML IV EMUL
INTRAVENOUS | Status: DC | PRN
  Administered 2023-11-10: 100 ug/kg/min via INTRAVENOUS

## 2023-11-10 MED ORDER — METHOCARBAMOL 500 MG PO TABS: 500.0000 mg | ORAL_TABLET | Freq: Four times a day (QID) | ORAL | Status: DC | PRN

## 2023-11-10 MED ORDER — PROPOFOL 1000 MG/100ML IV EMUL
INTRAVENOUS | Status: AC
  Filled 2023-11-10: qty 100

## 2023-11-10 MED ORDER — WATER FOR IRRIGATION, STERILE IR SOLN
Status: DC | PRN
  Administered 2023-11-10: 1000 mL

## 2023-11-10 MED ORDER — LIDOCAINE 2% (20 MG/ML) 5 ML SYRINGE
INTRAMUSCULAR | Status: DC | PRN
  Administered 2023-11-10: 20 mg via INTRAVENOUS

## 2023-11-10 MED ORDER — DEXAMETHASONE SODIUM PHOSPHATE 10 MG/ML IJ SOLN
INTRAMUSCULAR | Status: DC | PRN
Start: 2023-11-10 — End: 2023-11-10
  Administered 2023-11-10: 5 mg via INTRAVENOUS

## 2023-11-10 MED ORDER — ONDANSETRON HCL 4 MG/2ML IJ SOLN
INTRAMUSCULAR | Status: DC | PRN
  Administered 2023-11-10: 4 mg via INTRAVENOUS

## 2023-11-10 MED ORDER — ONDANSETRON HCL 4 MG PO TABS: 4.0000 mg | ORAL_TABLET | Freq: Four times a day (QID) | ORAL | Status: DC | PRN

## 2023-11-10 MED ORDER — GLYCOPYRROLATE PF 0.2 MG/ML IJ SOSY
PREFILLED_SYRINGE | INTRAMUSCULAR | Status: DC | PRN
  Administered 2023-11-10: .1 mg via INTRAVENOUS

## 2023-11-10 MED ORDER — CEFAZOLIN SODIUM-DEXTROSE 2-4 GM/100ML-% IV SOLN
2.0000 g | INTRAVENOUS | Status: AC
  Administered 2023-11-10: 2 g via INTRAVENOUS
  Filled 2023-11-10: qty 100

## 2023-11-10 MED ORDER — BUPIVACAINE-EPINEPHRINE 0.25% -1:200000 IJ SOLN
INTRAMUSCULAR | Status: DC | PRN
  Administered 2023-11-10: 30 mL

## 2023-11-10 MED ORDER — HYDROMORPHONE HCL 1 MG/ML IJ SOLN
0.2500 mg | INTRAMUSCULAR | Status: DC | PRN
  Administered 2023-11-10 (×4): 0.5 mg via INTRAVENOUS

## 2023-11-10 MED ORDER — HYDROMORPHONE HCL 1 MG/ML IJ SOLN: 0.5000 mg | INTRAMUSCULAR | Status: DC | PRN

## 2023-11-10 MED ORDER — BUPIVACAINE LIPOSOME 1.3 % IJ SUSP
INTRAMUSCULAR | Status: AC
  Filled 2023-11-10: qty 10

## 2023-11-10 MED ORDER — FENTANYL CITRATE PF 50 MCG/ML IJ SOSY
25.0000 ug | PREFILLED_SYRINGE | INTRAMUSCULAR | Status: DC | PRN
  Administered 2023-11-10: 50 ug via INTRAVENOUS

## 2023-11-10 MED ORDER — CHLORHEXIDINE GLUCONATE 0.12 % MT SOLN
15.0000 mL | Freq: Once | OROMUCOSAL | Status: AC
  Administered 2023-11-10: 15 mL via OROMUCOSAL

## 2023-11-10 MED ORDER — METHOCARBAMOL 1000 MG/10ML IJ SOLN: 500.0000 mg | Freq: Four times a day (QID) | INTRAMUSCULAR | Status: DC | PRN

## 2023-11-10 MED ORDER — ASPIRIN 81 MG PO TBEC: 81.0000 mg | DELAYED_RELEASE_TABLET | Freq: Two times a day (BID) | ORAL | 0 refills | Status: AC

## 2023-11-10 MED ORDER — BUPIVACAINE LIPOSOME 1.3 % IJ SUSP: 10.0000 mL | Freq: Once | INTRAMUSCULAR | Status: DC

## 2023-11-10 MED ORDER — 0.9 % SODIUM CHLORIDE (POUR BTL) OPTIME
TOPICAL | Status: DC | PRN
  Administered 2023-11-10: 1000 mL

## 2023-11-10 SURGICAL SUPPLY — 41 items
BAG COUNTER SPONGE SURGICOUNT (BAG) IMPLANT
BAG ZIPLOCK 12X15 (MISCELLANEOUS) IMPLANT
BENZOIN TINCTURE PRP APPL 2/3 (GAUZE/BANDAGES/DRESSINGS) IMPLANT
BLADE SAW SGTL 18X1.27X75 (BLADE) ×1 IMPLANT
CLSR STERI-STRIP ANTIMIC 1/2X4 (GAUZE/BANDAGES/DRESSINGS) IMPLANT
COVER PERINEAL POST (MISCELLANEOUS) ×1 IMPLANT
COVER SURGICAL LIGHT HANDLE (MISCELLANEOUS) ×1 IMPLANT
CUP ACET GRIPTION SERIS 56 100 (Trauma) IMPLANT
DRAPE FOOT SWITCH (DRAPES) ×1 IMPLANT
DRAPE STERI IOBAN 125X83 (DRAPES) ×1 IMPLANT
DRAPE U-SHAPE 47X51 STRL (DRAPES) ×2 IMPLANT
DRSG AQUACEL AG ADV 3.5X 6 (GAUZE/BANDAGES/DRESSINGS) ×1 IMPLANT
DURAPREP 26ML APPLICATOR (WOUND CARE) ×1 IMPLANT
ELECT REM PT RETURN 15FT ADLT (MISCELLANEOUS) ×1 IMPLANT
ELIMINATOR HOLE APEX DEPUY (Hips) IMPLANT
GAUZE XEROFORM 1X8 LF (GAUZE/BANDAGES/DRESSINGS) IMPLANT
GIPTION SERIES 56 100 (Trauma) ×1 IMPLANT
GLOVE BIOGEL PI IND STRL 8 (GLOVE) ×2 IMPLANT
GLOVE ECLIPSE 7.5 STRL STRAW (GLOVE) ×2 IMPLANT
GOWN STRL REUS W/ TWL XL LVL3 (GOWN DISPOSABLE) ×2 IMPLANT
HEAD CERAMIC DELTA 36 PLUS 1.5 (Hips) IMPLANT
HOLDER FOLEY CATH W/STRAP (MISCELLANEOUS) ×1 IMPLANT
HOOD PEEL AWAY T7 (MISCELLANEOUS) ×3 IMPLANT
KIT TURNOVER KIT A (KITS) IMPLANT
NDL HYPO 22X1.5 SAFETY MO (MISCELLANEOUS) ×2 IMPLANT
NEEDLE HYPO 22X1.5 SAFETY MO (MISCELLANEOUS) ×2
PACK ANTERIOR HIP CUSTOM (KITS) ×1 IMPLANT
PINNACLE ALTRX PLUS 4 N 36X56 (Hips) IMPLANT
SPIKE FLUID TRANSFER (MISCELLANEOUS) ×1 IMPLANT
STAPLER SKIN PROX WIDE 3.9 (STAPLE) IMPLANT
STEM FEM ACTIS HIGH SZ8 (Stem) IMPLANT
STRIP CLOSURE SKIN 1/2X4 (GAUZE/BANDAGES/DRESSINGS) IMPLANT
SUT ETHIBOND NAB CT1 #1 30IN (SUTURE) ×2 IMPLANT
SUT MNCRL AB 3-0 PS2 18 (SUTURE) IMPLANT
SUT VIC AB 0 CT1 36 (SUTURE) ×1 IMPLANT
SUT VIC AB 1 CT1 36 (SUTURE) ×1 IMPLANT
SUT VIC AB 2-0 CT1 TAPERPNT 27 (SUTURE) ×1 IMPLANT
SUT VICRYL+ 3-0 36IN CT-1 (SUTURE) IMPLANT
TRAY CATH INTERMITTENT SS 16FR (CATHETERS) IMPLANT
TRAY FOLEY MTR SLVR 16FR STAT (SET/KITS/TRAYS/PACK) IMPLANT
TUBE SUCTION HIGH CAP CLEAR NV (SUCTIONS) ×1 IMPLANT

## 2023-11-10 NOTE — Anesthesia Preprocedure Evaluation (Addendum)
 Anesthesia Evaluation  Patient identified by MRN, date of birth, ID band Patient awake    Reviewed: Allergy & Precautions, NPO status , Patient's Chart, lab work & pertinent test results  History of Anesthesia Complications Negative for: history of anesthetic complications  Airway Mallampati: II  TM Distance: >3 FB Neck ROM: Full    Dental  (+) Dental Advisory Given, Upper Dentures   Pulmonary neg pulmonary ROS   Pulmonary exam normal        Cardiovascular negative cardio ROS Normal cardiovascular exam     Neuro/Psych  Headaches  negative psych ROS   GI/Hepatic negative GI ROS, Neg liver ROS,,,  Endo/Other  negative endocrine ROS    Renal/GU negative Renal ROS     Musculoskeletal  (+) Arthritis ,   Scoliosis Hx multiple back surgeries    Abdominal   Peds  Hematology negative hematology ROS (+)   Anesthesia Other Findings Chronic pain Hx SCSI, 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    Reproductive/Obstetrics                             Anesthesia Physical Anesthesia Plan  ASA: 2  Anesthesia Plan: Spinal   Post-op Pain Management: Tylenol PO (pre-op)*   Induction:   PONV Risk Score and Plan: 1 and Treatment may vary due to age or medical condition and Propofol infusion  Airway Management Planned: Natural Airway and Simple Face Mask  Additional Equipment: None  Intra-op Plan:   Post-operative Plan:   Informed Consent: I have reviewed the patients History and Physical, chart, labs and discussed the procedure including the risks, benefits and alternatives for the proposed anesthesia with the patient or authorized representative who has indicated his/her understanding and acceptance.       Plan Discussed with: CRNA and Anesthesiologist  Anesthesia Plan Comments: (Labs reviewed, platelets acceptable. Discussed risks and benefits of spinal, including spinal/epidural hematoma,  infection, failed block, and PDPH. Also discussed general anesthesia as a backup plan, particularly in light of patient's scoliosis and hx of back surgeries. Patient expressed understanding and wished to proceed. )        Anesthesia Quick Evaluation

## 2023-11-10 NOTE — Progress Notes (Signed)
 Physical Therapy Treatment Patient Details Name: Brett Hernandez MRN: [id number] DOB: September 16, 1956 Today's Date: 11/10/2023   History of Present Illness 68 yo male presents to therapy s/p L THA, anterior approach on 11/10/2023 due to failure of conservative measures. 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    PT Comments   AZEEZ DUNKER is a 68 y.o. male POD 0 s/p L THA. Patient reports IND with mobility at baseline. Patient is now limited by functional impairments (see PT problem list below) and requires CGA and cues for transfers and gait with RW. Patient was able to ambulate 50 feet x 2 with RW and CGA and cues for safe walker management. Patient educated on safe sequencing for functional mobility tasks, fall risk prevention, use of RW, pain management and goal, CP/ice use, slowly increasing activity and car transfers pt verbalized understanding of safe guarding position for people assisting with mobility. Patient instructed in exercises to facilitate ROM and circulation reviewed and HO provided. Patient will benefit from continued skilled PT interventions to address impairments and progress towards PLOF. Patient has met mobility goals at adequate level for discharge home with family support and OPPT services; will continue to follow if pt continues acute stay to progress towards Mod I goals.    If plan is discharge home, recommend the following: A little help with walking and/or transfers;A little help with bathing/dressing/bathroom;Assistance with cooking/housework;Assist for transportation;Help with stairs or ramp for entrance   Can travel by private vehicle        Equipment Recommendations  None recommended by PT    Recommendations for Other Services       Precautions / Restrictions Precautions Precautions: Fall Restrictions Weight Bearing Restrictions Per  Provider Order: No     Mobility  Bed Mobility Overal bed mobility: Needs Assistance Bed Mobility: Supine to Sit     Supine to sit: Contact guard, HOB elevated     General bed mobility comments: pt seated in recliner when PT arrived    Transfers Overall transfer level: Needs assistance Equipment used: Rolling walker (2 wheels) Transfers: Sit to/from Stand Sit to Stand: Contact guard assist Stand pivot transfers: Min assist         General transfer comment: pt stated full sensation in B LE, buttock and groin region and no noted instability with standing and pre gait tasks, pt able to weight shift and cues provided for safety and proper UE and AD placement    Ambulation/Gait Ambulation/Gait assistance: Contact guard assist Gait Distance (Feet): 50 Feet Assistive device: Rolling walker (2 wheels) Gait Pattern/deviations: Step-to pattern, Antalgic, Trunk flexed Gait velocity: decreased     General Gait Details: B UE support at RW to offload  L LE in stance phase with slight trunk flexion, cues for safety and RW management   Stairs             Wheelchair Mobility     Tilt Bed    Modified Rankin (Stroke Patients Only)       Balance Overall balance assessment: Needs assistance (no fall hx) Sitting-balance support: Feet supported Sitting balance-Leahy Scale: Good     Standing balance support: Bilateral upper extremity supported, During functional activity, Reliant on assistive device for balance Standing balance-Leahy Scale: Fair Standing balance comment: static standing no UE support  Communication Communication Communication: No apparent difficulties  Cognition Arousal: Alert Behavior During Therapy: WFL for tasks assessed/performed   PT - Cognitive impairments: No apparent impairments                         Following commands: Intact      Cueing    Exercises Total Joint Exercises Ankle  Circles/Pumps: AROM, Both, 10 reps Quad Sets: AROM, Left, 5 reps Heel Slides: AROM, Left, 5 reps Hip ABduction/ADduction: AROM, Left, 5 reps, Standing Long Arc Quad: AROM, Left, 5 reps, Seated Knee Flexion: AROM, Left, 5 reps, Standing Standing Hip Extension: AROM, Left, 5 reps, Standing    General Comments        Pertinent Vitals/Pain Pain Assessment Pain Assessment: 0-10 Pain Score: 8  Pain Location: L hip and LE (pain mediations provided prior and at begining of therapy eval) Pain Descriptors / Indicators: Aching, Constant, Discomfort, Operative site guarding Pain Intervention(s): Limited activity within patient's tolerance, Monitored during session, Premedicated before session, Repositioned, Ice applied    Home Living Family/patient expects to be discharged to:: Private residence Living Arrangements: Spouse/significant other;Children Available Help at Discharge: Family;Available 24 hours/day Type of Home: House Home Access: Level entry       Home Layout: One level Home Equipment: Agricultural consultant (2 wheels);Cane - single point;Toilet riser      Prior Function            PT Goals (current goals can now be found in the care plan section) Acute Rehab PT Goals Patient Stated Goal: to be able to sleep and have improved mobility as well as relief from LBP PT Goal Formulation: With patient Time For Goal Achievement: 11/24/23 Potential to Achieve Goals: Good Progress towards PT goals: Progressing toward goals    Frequency    7X/week      PT Plan      Co-evaluation              AM-PAC PT "6 Clicks" Mobility   Outcome Measure  Help needed turning from your back to your side while in a flat bed without using bedrails?: None Help needed moving from lying on your back to sitting on the side of a flat bed without using bedrails?: A Little Help needed moving to and from a bed to a chair (including a wheelchair)?: A Little Help needed standing up from a chair  using your arms (e.g., wheelchair or bedside chair)?: A Little Help needed to walk in hospital room?: A Little Help needed climbing 3-5 steps with a railing? : A Lot 6 Click Score: 18    End of Session Equipment Utilized During Treatment: Gait belt Activity Tolerance: Patient tolerated treatment well;No increased pain Patient left: in chair;with call bell/phone within reach Nurse Communication: Mobility status;Other (comment) (pt readiness from PT standpoint for d/c home) PT Visit Diagnosis: Unsteadiness on feet (R26.81);Other abnormalities of gait and mobility (R26.89);Muscle weakness (generalized) (M62.81);Difficulty in walking, not elsewhere classified (R26.2);Pain Pain - Right/Left: Left Pain - part of body: Hip;Leg     Time: 4098-1191 PT Time Calculation (min) (ACUTE ONLY): 17 min  Charges:    $Gait Training: 8-22 mins $Therapeutic Exercise: 8-22 mins $Therapeutic Activity: 8-22 mins PT General Charges $$ ACUTE PT VISIT: 1 Visit                     Johnny Bridge, PT Acute Rehab    Jacqualyn Posey 11/10/2023, 6:00 PM

## 2023-11-10 NOTE — Discharge Instructions (Signed)

## 2023-11-10 NOTE — Anesthesia Procedure Notes (Signed)
 Spinal  Patient location during procedure: OR Start time: 11/10/2023 9:44 AM End time: 11/10/2023 9:49 AM Reason for block: surgical anesthesia Staffing Performed: anesthesiologist  Anesthesiologist: Beryle Lathe, MD Performed by: Beryle Lathe, MD Authorized by: Beryle Lathe, MD   Preanesthetic Checklist Completed: patient identified, IV checked, risks and benefits discussed, surgical consent, monitors and equipment checked, pre-op evaluation and timeout performed Spinal Block Patient position: sitting Prep: DuraPrep Patient monitoring: heart rate, cardiac monitor, continuous pulse ox and blood pressure Approach: right paramedian Location: L2-3 Injection technique: single-shot Needle Needle type: Quincke  Needle gauge: 22 G Additional Notes Consent was obtained prior to the procedure with all questions answered and concerns addressed. Risks including, but not limited to, bleeding, infection, nerve damage, paralysis, failed block, inadequate analgesia, allergic reaction, high spinal, itching, and headache were discussed and the patient wished to proceed. Functioning IV was confirmed and monitors were applied. Sterile prep and drape, including hand hygiene, mask, and sterile gloves were used. The patient was positioned and the spine was prepped. The skin was anesthetized with lidocaine. First attempt midline unsuccessful. 2nd attempt at same level, right paramedian approach successful. Free flow of clear CSF was obtained prior to injecting local anesthetic into the CSF. The spinal needle aspirated freely following injection. The needle was carefully withdrawn. The patient tolerated the procedure well.   Leslye Peer, MD

## 2023-11-10 NOTE — Transfer of Care (Signed)
 Immediate Anesthesia Transfer of Care Note  Patient: Brett Hernandez  Procedure(s) Performed: TOTAL HIP ARTHROPLASTY ANTERIOR APPROACH (Left: Hip)  Patient Location: PACU  Anesthesia Type:MAC and Spinal  Level of Consciousness: awake, alert , oriented, and patient cooperative  Airway & Oxygen Therapy: Patient Spontanous Breathing and Patient connected to face mask oxygen  Post-op Assessment: Report given to RN and Post -op Vital signs reviewed and stable  Post vital signs: Reviewed and stable  Last Vitals:  Vitals Value Taken Time  BP 115/76 11/10/23 1123  Temp    Pulse 74 11/10/23 1127  Resp 12 11/10/23 1127  SpO2 98 % 11/10/23 1127  Vitals shown include unfiled device data.  Last Pain:  Vitals:   11/10/23 0829  TempSrc: Oral  PainSc: 0-No pain      Patients Stated Pain Goal: 5 (11/10/23 0829)  Complications: No notable events documented.

## 2023-11-10 NOTE — Op Note (Signed)
 PATIENT ID:      Brett Hernandez  MRN:     [id number] DOB/AGE:    Jan 30, 1956 / 69 y.o.       OPERATIVE REPORT    DATE OF PROCEDURE:  11/10/2023       PREOPERATIVE DIAGNOSIS:  LEFT HIP DEGENERATIVE JOINT DISEASE                                                       Estimated body mass index is 27.32 kg/m as calculated from the following:   Height as of this encounter: 5\' 9"  (1.753 m).   Weight as of this encounter: 83.9 kg.     POSTOPERATIVE DIAGNOSIS:  LEFT HIP DEGENERATIVE JOINT DISEASE                                                           PROCEDURE:  1. left total hip arthroplasty using a 56 mm DePuy gription Cup, [employer],  +4 liner, a +1.5 36 mm ceramic head,  and a #8  Actis stem, 2.interpretation of multiple intraoperative fluoroscopic images   SURGEON: Harvie Junior    ASSISTANT:   Gus Puma PA-C  (present throughout entire procedure and necessary for timely completion of the procedure)  ANESTHESIA: spinal  BLOOD LOSS: 300 Tranexamic Acid: 1 gram IV DRAINS: None COMPLICATIONS: None    NDICATIONS FOR PROCEDURE:Patient with end-stage arthritis of the left hip.  X-rays show bone-on-bone arthritic changes. Despite conservative measures with observation, anti-inflammatory medicine, narcotics, use of a cane, has severe unremitting pain and can ambulate only less than 1 block before resting.  Patient desires elective left total hip arthroplasty to decrease pain and increase function. The risks, benefits, and alternatives were discussed at length including but not limited to the risks of infection, bleeding, nerve injury, stiffness, blood clots, the need for revision surgery, cardiopulmonary complications, among others, and they were willing to proceed.Benefits have been discussed. Questions answered.     PROCEDURE IN DETAIL: The patient was identified by armband,  received preoperative IV antibiotics in the holding area at [facility], taken to the  operating room , appropriate anesthetic monitors  were attached and spinal anesthesia was induced.  The patient was placed onto the hot bed and all bony prominences were well-padded.The left hip was prepped and draped for an anterior approach to the hip.  An incision was made and the subcutaneous dissection was down to the level of the tensor fascia.  The fascia was opened and finger dissected.  The bleeders coming across the anterior portion of the hip were identified and cauterized. Retractors were put in place above and below the femoral neck.  The capsule was opened and tagged and a provisional neck cut was made.  The head was removed and sized on the back table.  The acetabulum was sequentially reamed to a level of 55 mm and a 56 mm porous-coated pinnacle cup was hammered into place with 45 of lateral opening and 30 of anteversion.fluoroscopy was used to ensure this position of the cup. The final +4 liner ws placed  Attention was turned towards  the femur where the leg was actually rotated, extended, and adduction did.  The femur was sequentially broached until a size of 12 broach gave a perfect fit and fill.at this point a  1.5 mm delta ceramic hip ball was placed and the hip reduced.  Fluoroscopic images were taken to assess the leg length, fit and fill of the stem, and cup position.  We were happy with the construct at this point.  The 8 broach was removed and a final Actis stem with standard offset  and a 1.5 mm ceramic hip ball was placed and reduced.  Final images were taken to make certain there were happy with the position at this point.   The capsule was closed with #1 Vicryl suture.  The tensor fascia was closed with 0 Vicryl suture.  The skin was then closed with combination of 0 and 2-0 Vicryl suture.  The top layer was with 3-0 Monocryl suture.  Benzoin and Steri-Strips were applied  and a sterile compressive dressing was applied and the patient taken to recovery room she noted be in  satisfactory condition.  Past medical Motion for the procedure was approximately 300 cc.  Of note Gus Puma was present for the entire case and assisted by retraction of tissues, manipulation of the leg, and closing the minimize or time.   Harvie Junior 11/10/2023, 11:01 AM

## 2023-11-10 NOTE — Evaluation (Signed)
 Physical Therapy Evaluation Patient Details Name: Brett Hernandez MRN: [id number] DOB: 1956-07-28 Today's Date: 11/10/2023  History of Present Illness  68 yo male presents to therapy s/p L THA, anterior approach on 11/10/2023 due to failure of conservative measures. 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  Clinical Impression    Brett Hernandez is a 68 y.o. male POD 0 s/p L THA. Patient reports IND with mobility at baseline. Patient is now limited by functional impairments (see PT problem list below) and requires CGA for bed mobility and min A for transfers. Patient was unable to safely ambulate at time of eval due to B LE reported abn sensation and noted instability.  Patient instructed in exercise to facilitate ROM and circulation to manage edema in supine. Patient will benefit from continued skilled PT interventions to address impairments and progress towards PLOF. Acute PT will follow to progress mobility and stair training in preparation for safe discharge home with family support and OP PT services.       If plan is discharge home, recommend the following: A little help with walking and/or transfers;A little help with bathing/dressing/bathroom;Assistance with cooking/housework;Assist for transportation;Help with stairs or ramp for entrance   Can travel by private vehicle        Equipment Recommendations None recommended by PT  Recommendations for Other Services       Functional Status Assessment Patient has had a recent decline in their functional status and demonstrates the ability to make significant improvements in function in a reasonable and predictable amount of time.     Precautions / Restrictions Precautions Precautions: Fall Restrictions Weight Bearing Restrictions Per Provider Order: No      Mobility  Bed Mobility Overal bed mobility: Needs  Assistance Bed Mobility: Supine to Sit     Supine to sit: Contact guard, HOB elevated     General bed mobility comments: min cues    Transfers Overall transfer level: Needs assistance Equipment used: Rolling walker (2 wheels) Transfers: Sit to/from Stand, Bed to chair/wheelchair/BSC Sit to Stand: Min assist Stand pivot transfers: Min assist         General transfer comment: pt reported abn sensation B LE and noted instabiltiy with pregait tasks in standing especially with weight shifign to the L, pt required min A for sit to stand from EOB and then Min A to safely complete SPT bed to recliner with heavy reliance on B UE support for safety and stabiltiy    Ambulation/Gait               General Gait Details: NT due to B LE instability and abn sensation  Stairs            Wheelchair Mobility     Tilt Bed    Modified Rankin (Stroke Patients Only)       Balance Overall balance assessment: Needs assistance (no fall hx) Sitting-balance support: Feet supported Sitting balance-Leahy Scale: Good     Standing balance support: Bilateral upper extremity supported, During functional activity, Reliant on assistive device for balance Standing balance-Leahy Scale: Poor                               Pertinent Vitals/Pain Pain Assessment Pain Assessment: 0-10 Pain Score: 8  Pain Location: L hip and LE (pain mediations provided prior and  at begining of therapy eval) Pain Descriptors / Indicators: Aching, Constant, Discomfort, Operative site guarding Pain Intervention(s): Limited activity within patient's tolerance, Monitored during session, Premedicated before session, Repositioned, RN gave pain meds during session, Ice applied    Home Living Family/patient expects to be discharged to:: Private residence Living Arrangements: Spouse/significant other;Children Available Help at Discharge: Family;Available 24 hours/day Type of Home: House Home Access:  Level entry       Home Layout: One level Home Equipment: Agricultural consultant (2 wheels);Cane - single point;Toilet riser      Prior Function Prior Level of Function : Independent/Modified Independent;Driving;Working/employed             Mobility Comments: IND no AD for all ADLs, self care tasks and IADLs       Extremity/Trunk Assessment        Lower Extremity Assessment Lower Extremity Assessment: LLE deficits/detail LLE Deficits / Details: ankle DF/PF 5/5 LLE Sensation: decreased light touch;decreased proprioception (B LE , hip and buttock/groin)    Cervical / Trunk Assessment Cervical / Trunk Assessment: Neck Surgery;Back Surgery  Communication   Communication Communication: No apparent difficulties    Cognition Arousal: Alert Behavior During Therapy: WFL for tasks assessed/performed   PT - Cognitive impairments: No apparent impairments                         Following commands: Intact       Cueing       General Comments      Exercises Total Joint Exercises Ankle Circles/Pumps: AROM, Both, 10 reps Quad Sets: AROM, Left, 5 reps Heel Slides: AROM, Left, 5 reps   Assessment/Plan    PT Assessment Patient needs continued PT services  PT Problem List Decreased strength;Decreased range of motion;Decreased activity tolerance;Decreased balance;Decreased mobility;Decreased coordination;Pain       PT Treatment Interventions DME instruction;Gait training;Functional mobility training;Therapeutic activities;Therapeutic exercise;Balance training;Neuromuscular re-education;Patient/family education;Modalities    PT Goals (Current goals can be found in the Care Plan section)  Acute Rehab PT Goals Patient Stated Goal: to be able to sleep and have improved mobility as well as relief from LBP PT Goal Formulation: With patient Time For Goal Achievement: 11/24/23 Potential to Achieve Goals: Good    Frequency 7X/week     Co-evaluation                AM-PAC PT "6 Clicks" Mobility  Outcome Measure Help needed turning from your back to your side while in a flat bed without using bedrails?: None Help needed moving from lying on your back to sitting on the side of a flat bed without using bedrails?: A Little Help needed moving to and from a bed to a chair (including a wheelchair)?: A Little Help needed standing up from a chair using your arms (e.g., wheelchair or bedside chair)?: A Little Help needed to walk in hospital room?: Total Help needed climbing 3-5 steps with a railing? : Total 6 Click Score: 15    End of Session Equipment Utilized During Treatment: Gait belt Activity Tolerance: Treatment limited secondary to medical complications (Comment) (B LE abn sensation and instabiltiy) Patient left: in chair;with call bell/phone within reach Nurse Communication: Mobility status;Other (comment) (pt not ready at this time for same day d/c) PT Visit Diagnosis: Unsteadiness on feet (R26.81);Other abnormalities of gait and mobility (R26.89);Muscle weakness (generalized) (M62.81);Difficulty in walking, not elsewhere classified (R26.2);Pain Pain - Right/Left: Left Pain - part of body: Hip;Leg    Time: 1610-9604 PT  Time Calculation (min) (ACUTE ONLY): 39 min   Charges:   PT Evaluation $PT Eval Low Complexity: 1 Low PT Treatments $Therapeutic Exercise: 8-22 mins $Therapeutic Activity: 8-22 mins PT General Charges $$ ACUTE PT VISIT: 1 Visit         Johnny Bridge, PT Acute Rehab   Jacqualyn Posey 11/10/2023, 4:47 PM

## 2023-11-10 NOTE — Anesthesia Postprocedure Evaluation (Signed)
 Anesthesia Post Note  Patient: Brett Hernandez  Procedure(s) Performed: TOTAL HIP ARTHROPLASTY ANTERIOR APPROACH (Left: Hip)     Patient location during evaluation: PACU Anesthesia Type: Spinal Level of consciousness: awake and alert Pain management: pain level controlled Vital Signs Assessment: post-procedure vital signs reviewed and stable Respiratory status: spontaneous breathing and respiratory function stable Cardiovascular status: blood pressure returned to baseline and stable Postop Assessment: spinal receding and no apparent nausea or vomiting Anesthetic complications: no   No notable events documented.  Last Vitals:  Vitals:   11/10/23 1200 11/10/23 1215  BP: 128/77 126/78  Pulse: (!) 56 (!) 52  Resp: 18 12  Temp:    SpO2: 91% 92%                  Beryle Lathe

## 2023-11-11 ENCOUNTER — Encounter (HOSPITAL_COMMUNITY): Payer: Self-pay | Admitting: Orthopedic Surgery

## 2023-11-13 DIAGNOSIS — M25652 Stiffness of left hip, not elsewhere classified: Secondary | ICD-10-CM | POA: Diagnosis not present

## 2023-11-13 DIAGNOSIS — Z96642 Presence of left artificial hip joint: Secondary | ICD-10-CM | POA: Diagnosis not present

## 2023-11-21 DIAGNOSIS — Z96642 Presence of left artificial hip joint: Secondary | ICD-10-CM | POA: Diagnosis not present

## 2023-11-21 DIAGNOSIS — M25652 Stiffness of left hip, not elsewhere classified: Secondary | ICD-10-CM | POA: Diagnosis not present

## 2023-11-25 DIAGNOSIS — M25652 Stiffness of left hip, not elsewhere classified: Secondary | ICD-10-CM | POA: Diagnosis not present

## 2023-11-25 NOTE — Discharge Instructions (Signed)

## 2023-11-26 ENCOUNTER — Other Ambulatory Visit: Payer: Self-pay

## 2023-11-26 ENCOUNTER — Ambulatory Visit
Admission: RE | Admit: 2023-11-26 | Discharge: 2023-11-26 | Disposition: A | Discharge status: Home / Self Care | Payer: Self-pay | Source: Ambulatory Visit | Attending: Orthopedic Surgery | Admitting: Orthopedic Surgery

## 2023-11-26 DIAGNOSIS — M5412 Radiculopathy, cervical region: Secondary | ICD-10-CM

## 2023-11-26 DIAGNOSIS — M4723 Other spondylosis with radiculopathy, cervicothoracic region: Secondary | ICD-10-CM | POA: Diagnosis not present

## 2023-11-26 DIAGNOSIS — M50123 Cervical disc disorder at C6-C7 level with radiculopathy: Secondary | ICD-10-CM | POA: Diagnosis not present

## 2023-11-26 DIAGNOSIS — Z981 Arthrodesis status: Secondary | ICD-10-CM | POA: Diagnosis not present

## 2023-11-26 DIAGNOSIS — M50121 Cervical disc disorder at C4-C5 level with radiculopathy: Secondary | ICD-10-CM | POA: Diagnosis not present

## 2023-11-26 MED ORDER — MEPERIDINE HCL 50 MG/ML IJ SOLN: 50.0000 mg | Freq: Once | INTRAMUSCULAR | Status: DC | PRN

## 2023-11-26 MED ORDER — IOPAMIDOL (ISOVUE-M 300) INJECTION 61%
10.0000 mL | Freq: Once | INTRAMUSCULAR | Status: AC
  Administered 2023-11-26: 10 mL via INTRATHECAL

## 2023-11-26 MED ORDER — ONDANSETRON HCL 4 MG/2ML IJ SOLN: 4.0000 mg | Freq: Once | INTRAMUSCULAR | Status: DC | PRN

## 2023-11-26 MED ORDER — DIAZEPAM 5 MG PO TABS: 5.0000 mg | ORAL_TABLET | Freq: Once | ORAL | Status: DC

## 2023-11-26 NOTE — Progress Notes (Signed)
 Pt also reports he has a spinal cord stimulator that has been deactivated for 2 years. Pt verbalized understanding.

## 2023-12-02 DIAGNOSIS — M542 Cervicalgia: Secondary | ICD-10-CM | POA: Diagnosis not present

## 2023-12-02 DIAGNOSIS — R7989 Other specified abnormal findings of blood chemistry: Secondary | ICD-10-CM | POA: Diagnosis not present

## 2023-12-02 DIAGNOSIS — E7800 Pure hypercholesterolemia, unspecified: Secondary | ICD-10-CM | POA: Diagnosis not present

## 2023-12-02 DIAGNOSIS — M545 Low back pain, unspecified: Secondary | ICD-10-CM | POA: Diagnosis not present

## 2023-12-02 DIAGNOSIS — R7303 Prediabetes: Secondary | ICD-10-CM | POA: Diagnosis not present

## 2023-12-02 DIAGNOSIS — Z125 Encounter for screening for malignant neoplasm of prostate: Secondary | ICD-10-CM | POA: Diagnosis not present

## 2023-12-09 ENCOUNTER — Other Ambulatory Visit (HOSPITAL_COMMUNITY): Payer: Self-pay

## 2023-12-09 DIAGNOSIS — G629 Polyneuropathy, unspecified: Secondary | ICD-10-CM | POA: Diagnosis not present

## 2023-12-09 DIAGNOSIS — Z0000 Encounter for general adult medical examination without abnormal findings: Secondary | ICD-10-CM | POA: Diagnosis not present

## 2023-12-09 DIAGNOSIS — R7989 Other specified abnormal findings of blood chemistry: Secondary | ICD-10-CM | POA: Diagnosis not present

## 2023-12-09 DIAGNOSIS — E7800 Pure hypercholesterolemia, unspecified: Secondary | ICD-10-CM

## 2023-12-09 DIAGNOSIS — Z9689 Presence of other specified functional implants: Secondary | ICD-10-CM | POA: Diagnosis not present

## 2023-12-09 DIAGNOSIS — G8929 Other chronic pain: Secondary | ICD-10-CM | POA: Diagnosis not present

## 2023-12-09 DIAGNOSIS — M51379 Other intervertebral disc degeneration, lumbosacral region without mention of lumbar back pain or lower extremity pain: Secondary | ICD-10-CM | POA: Diagnosis not present

## 2023-12-09 DIAGNOSIS — R7303 Prediabetes: Secondary | ICD-10-CM | POA: Diagnosis not present

## 2023-12-17 DIAGNOSIS — H18512 Endothelial corneal dystrophy, left eye: Secondary | ICD-10-CM | POA: Diagnosis not present

## 2023-12-17 DIAGNOSIS — H25812 Combined forms of age-related cataract, left eye: Secondary | ICD-10-CM | POA: Diagnosis not present

## 2023-12-17 DIAGNOSIS — H2512 Age-related nuclear cataract, left eye: Secondary | ICD-10-CM | POA: Diagnosis not present

## 2023-12-24 ENCOUNTER — Ambulatory Visit (HOSPITAL_COMMUNITY)
Admission: RE | Admit: 2023-12-24 | Discharge: 2023-12-24 | Disposition: A | Discharge status: Home / Self Care | Payer: Self-pay | Source: Ambulatory Visit

## 2023-12-24 DIAGNOSIS — E7800 Pure hypercholesterolemia, unspecified: Secondary | ICD-10-CM | POA: Insufficient documentation

## 2023-12-25 DIAGNOSIS — M5412 Radiculopathy, cervical region: Secondary | ICD-10-CM | POA: Diagnosis not present

## 2023-12-25 DIAGNOSIS — M47812 Spondylosis without myelopathy or radiculopathy, cervical region: Secondary | ICD-10-CM | POA: Diagnosis not present

## 2023-12-25 DIAGNOSIS — Z79891 Long term (current) use of opiate analgesic: Secondary | ICD-10-CM | POA: Diagnosis not present

## 2023-12-25 DIAGNOSIS — G894 Chronic pain syndrome: Secondary | ICD-10-CM | POA: Diagnosis not present

## 2023-12-25 DIAGNOSIS — M47816 Spondylosis without myelopathy or radiculopathy, lumbar region: Secondary | ICD-10-CM | POA: Diagnosis not present

## 2023-12-25 DIAGNOSIS — M5416 Radiculopathy, lumbar region: Secondary | ICD-10-CM | POA: Diagnosis not present

## 2023-12-25 DIAGNOSIS — M25562 Pain in left knee: Secondary | ICD-10-CM | POA: Diagnosis not present

## 2023-12-25 DIAGNOSIS — Z5181 Encounter for therapeutic drug level monitoring: Secondary | ICD-10-CM | POA: Diagnosis not present

## 2023-12-25 DIAGNOSIS — M169 Osteoarthritis of hip, unspecified: Secondary | ICD-10-CM | POA: Diagnosis not present

## 2023-12-25 DIAGNOSIS — M7062 Trochanteric bursitis, left hip: Secondary | ICD-10-CM | POA: Diagnosis not present

## 2024-01-01 DIAGNOSIS — M1712 Unilateral primary osteoarthritis, left knee: Secondary | ICD-10-CM | POA: Diagnosis not present

## 2024-01-14 DIAGNOSIS — M1712 Unilateral primary osteoarthritis, left knee: Secondary | ICD-10-CM | POA: Diagnosis not present

## 2024-01-22 DIAGNOSIS — M1712 Unilateral primary osteoarthritis, left knee: Secondary | ICD-10-CM | POA: Diagnosis not present

## 2024-01-22 DIAGNOSIS — M25552 Pain in left hip: Secondary | ICD-10-CM | POA: Diagnosis not present

## 2024-02-05 DIAGNOSIS — Z01818 Encounter for other preprocedural examination: Secondary | ICD-10-CM | POA: Diagnosis not present

## 2024-02-05 DIAGNOSIS — E7800 Pure hypercholesterolemia, unspecified: Secondary | ICD-10-CM | POA: Diagnosis not present

## 2024-03-01 DIAGNOSIS — M1712 Unilateral primary osteoarthritis, left knee: Secondary | ICD-10-CM | POA: Diagnosis not present

## 2024-03-10 DIAGNOSIS — M1712 Unilateral primary osteoarthritis, left knee: Secondary | ICD-10-CM | POA: Diagnosis not present

## 2024-03-10 DIAGNOSIS — Z96652 Presence of left artificial knee joint: Secondary | ICD-10-CM | POA: Diagnosis not present

## 2024-03-10 DIAGNOSIS — G8918 Other acute postprocedural pain: Secondary | ICD-10-CM | POA: Diagnosis not present

## 2024-03-12 DIAGNOSIS — M25662 Stiffness of left knee, not elsewhere classified: Secondary | ICD-10-CM | POA: Diagnosis not present

## 2024-03-12 DIAGNOSIS — Z96652 Presence of left artificial knee joint: Secondary | ICD-10-CM | POA: Diagnosis not present

## 2024-03-15 DIAGNOSIS — Z96652 Presence of left artificial knee joint: Secondary | ICD-10-CM | POA: Diagnosis not present

## 2024-03-15 DIAGNOSIS — M25662 Stiffness of left knee, not elsewhere classified: Secondary | ICD-10-CM | POA: Diagnosis not present

## 2024-03-17 DIAGNOSIS — M25662 Stiffness of left knee, not elsewhere classified: Secondary | ICD-10-CM | POA: Diagnosis not present

## 2024-03-17 DIAGNOSIS — Z96652 Presence of left artificial knee joint: Secondary | ICD-10-CM | POA: Diagnosis not present

## 2024-03-23 DIAGNOSIS — Z96652 Presence of left artificial knee joint: Secondary | ICD-10-CM | POA: Diagnosis not present

## 2024-03-23 DIAGNOSIS — M25662 Stiffness of left knee, not elsewhere classified: Secondary | ICD-10-CM | POA: Diagnosis not present

## 2024-03-25 DIAGNOSIS — M25662 Stiffness of left knee, not elsewhere classified: Secondary | ICD-10-CM | POA: Diagnosis not present

## 2024-03-25 DIAGNOSIS — Z96652 Presence of left artificial knee joint: Secondary | ICD-10-CM | POA: Diagnosis not present

## 2024-03-26 DIAGNOSIS — M25662 Stiffness of left knee, not elsewhere classified: Secondary | ICD-10-CM | POA: Diagnosis not present

## 2024-03-26 DIAGNOSIS — Z96652 Presence of left artificial knee joint: Secondary | ICD-10-CM | POA: Diagnosis not present

## 2024-03-30 DIAGNOSIS — M25662 Stiffness of left knee, not elsewhere classified: Secondary | ICD-10-CM | POA: Diagnosis not present

## 2024-03-30 DIAGNOSIS — Z96652 Presence of left artificial knee joint: Secondary | ICD-10-CM | POA: Diagnosis not present

## 2024-04-01 DIAGNOSIS — M25662 Stiffness of left knee, not elsewhere classified: Secondary | ICD-10-CM | POA: Diagnosis not present

## 2024-04-01 DIAGNOSIS — Z96652 Presence of left artificial knee joint: Secondary | ICD-10-CM | POA: Diagnosis not present

## 2024-04-06 DIAGNOSIS — M25662 Stiffness of left knee, not elsewhere classified: Secondary | ICD-10-CM | POA: Diagnosis not present

## 2024-04-06 DIAGNOSIS — Z96652 Presence of left artificial knee joint: Secondary | ICD-10-CM | POA: Diagnosis not present

## 2024-04-08 DIAGNOSIS — M25662 Stiffness of left knee, not elsewhere classified: Secondary | ICD-10-CM | POA: Diagnosis not present

## 2024-04-08 DIAGNOSIS — Z96652 Presence of left artificial knee joint: Secondary | ICD-10-CM | POA: Diagnosis not present

## 2024-04-15 DIAGNOSIS — Z96652 Presence of left artificial knee joint: Secondary | ICD-10-CM | POA: Diagnosis not present

## 2024-04-15 DIAGNOSIS — M25662 Stiffness of left knee, not elsewhere classified: Secondary | ICD-10-CM | POA: Diagnosis not present

## 2024-04-20 DIAGNOSIS — Z96652 Presence of left artificial knee joint: Secondary | ICD-10-CM | POA: Diagnosis not present

## 2024-04-20 DIAGNOSIS — R0982 Postnasal drip: Secondary | ICD-10-CM | POA: Diagnosis not present

## 2024-04-20 DIAGNOSIS — M25662 Stiffness of left knee, not elsewhere classified: Secondary | ICD-10-CM | POA: Diagnosis not present

## 2024-04-20 DIAGNOSIS — H903 Sensorineural hearing loss, bilateral: Secondary | ICD-10-CM | POA: Diagnosis not present

## 2024-04-20 DIAGNOSIS — R599 Enlarged lymph nodes, unspecified: Secondary | ICD-10-CM | POA: Diagnosis not present

## 2024-04-20 DIAGNOSIS — E7800 Pure hypercholesterolemia, unspecified: Secondary | ICD-10-CM | POA: Diagnosis not present

## 2024-04-21 DIAGNOSIS — M160 Bilateral primary osteoarthritis of hip: Secondary | ICD-10-CM | POA: Diagnosis not present

## 2024-04-21 DIAGNOSIS — M79641 Pain in right hand: Secondary | ICD-10-CM | POA: Diagnosis not present

## 2024-04-21 DIAGNOSIS — M2550 Pain in unspecified joint: Secondary | ICD-10-CM | POA: Diagnosis not present

## 2024-04-21 DIAGNOSIS — M170 Bilateral primary osteoarthritis of knee: Secondary | ICD-10-CM | POA: Diagnosis not present

## 2024-04-21 DIAGNOSIS — G8929 Other chronic pain: Secondary | ICD-10-CM | POA: Diagnosis not present

## 2024-04-21 DIAGNOSIS — Z1330 Encounter for screening examination for mental health and behavioral disorders, unspecified: Secondary | ICD-10-CM | POA: Diagnosis not present

## 2024-04-21 DIAGNOSIS — M47816 Spondylosis without myelopathy or radiculopathy, lumbar region: Secondary | ICD-10-CM | POA: Diagnosis not present

## 2024-04-21 DIAGNOSIS — G894 Chronic pain syndrome: Secondary | ICD-10-CM | POA: Diagnosis not present

## 2024-04-21 DIAGNOSIS — M79642 Pain in left hand: Secondary | ICD-10-CM | POA: Diagnosis not present

## 2024-04-22 DIAGNOSIS — M25662 Stiffness of left knee, not elsewhere classified: Secondary | ICD-10-CM | POA: Diagnosis not present

## 2024-04-22 DIAGNOSIS — Z96652 Presence of left artificial knee joint: Secondary | ICD-10-CM | POA: Diagnosis not present

## 2024-04-27 DIAGNOSIS — M25562 Pain in left knee: Secondary | ICD-10-CM | POA: Diagnosis not present

## 2024-04-27 DIAGNOSIS — M7062 Trochanteric bursitis, left hip: Secondary | ICD-10-CM | POA: Diagnosis not present

## 2024-04-27 DIAGNOSIS — Z96652 Presence of left artificial knee joint: Secondary | ICD-10-CM | POA: Diagnosis not present

## 2024-04-27 DIAGNOSIS — M25662 Stiffness of left knee, not elsewhere classified: Secondary | ICD-10-CM | POA: Diagnosis not present

## 2024-04-29 DIAGNOSIS — Z96652 Presence of left artificial knee joint: Secondary | ICD-10-CM | POA: Diagnosis not present

## 2024-04-29 DIAGNOSIS — M25662 Stiffness of left knee, not elsewhere classified: Secondary | ICD-10-CM | POA: Diagnosis not present

## 2024-04-29 DIAGNOSIS — G8929 Other chronic pain: Secondary | ICD-10-CM | POA: Diagnosis not present

## 2024-05-03 DIAGNOSIS — M25512 Pain in left shoulder: Secondary | ICD-10-CM | POA: Diagnosis not present

## 2024-05-04 DIAGNOSIS — M25662 Stiffness of left knee, not elsewhere classified: Secondary | ICD-10-CM | POA: Diagnosis not present

## 2024-05-04 DIAGNOSIS — Z96652 Presence of left artificial knee joint: Secondary | ICD-10-CM | POA: Diagnosis not present

## 2024-05-05 DIAGNOSIS — J302 Other seasonal allergic rhinitis: Secondary | ICD-10-CM | POA: Diagnosis not present

## 2024-05-05 DIAGNOSIS — J029 Acute pharyngitis, unspecified: Secondary | ICD-10-CM | POA: Diagnosis not present

## 2024-05-05 DIAGNOSIS — R599 Enlarged lymph nodes, unspecified: Secondary | ICD-10-CM | POA: Diagnosis not present

## 2024-05-06 DIAGNOSIS — M25662 Stiffness of left knee, not elsewhere classified: Secondary | ICD-10-CM | POA: Diagnosis not present

## 2024-05-06 DIAGNOSIS — Z96652 Presence of left artificial knee joint: Secondary | ICD-10-CM | POA: Diagnosis not present

## 2024-05-12 DIAGNOSIS — H903 Sensorineural hearing loss, bilateral: Secondary | ICD-10-CM | POA: Diagnosis not present

## 2024-05-12 NOTE — Progress Notes (Signed)
 Otolaryngology Clinic Note  HPI:    Brett Hernandez is a 68 y.o. male who presents as a return patient.  Brett Hernandez returns today for follow-up of asymmetrical sensorineural hearing loss.  He feels like there has been perhaps a little bit of change since his last visit.  He is wearing a hearing aid in the left ear.  He denies any pain, drainage, bleeding.  PMH/Meds/All/SocHx/FamHx/ROS:   Medical History[1]  Surgical History[2]  No family history of bleeding disorders, wound healing problems or difficulty with anesthesia.      Current Medications[3]  A complete ROS was performed with pertinent positives/negatives noted in the HPI. The remainder of the ROS are negative.    Physical Exam:    BP 122/75   Pulse 75   Temp 97.7 F (36.5 C) (Temporal)   Resp 18   Ht 1.753 m (5' 9)   Wt 82.5 kg (181 lb 12.8 oz)   BMI 26.85 kg/m   Constitutional:  Patient appears well-nourished and well-developed. No acute distress.   Head/Face: Facial features are symmetric. Skull is normocephalic. Hair and scalp are normal. Normal temporal artery pulses. TMJ shows no joint deformity swelling or erythema.   Eyes: Pupils are equal, round and reactive to light. Conjunctiva and lids are normal. Normal extraocular mobility. Normal vision by patient report.   Ears:     Right: Pinna and external meatus normal, normal ear canal skin and caliber without excessive cerumen or drainage. Tympanic membranes intact without effusion or infection. Hearing normal.    Left: Pinna and external meatus normal, normal ear canal skin and caliber without excessive cerumen or drainage. Tympanic membranes intact without effusion or infection. Hearing normal.   Nose/Sinus/Nasopharynx: Septum is normal. Normal nasal mucosa. Normal inferior turbinates.    Oral cavity/Oropharynx: Lips normal, teeth and gums normal with good dentition, normal oral vestibule. Normal floor of mouth, tongue and oral mucosa, no mucosal  lesions, ulcer or mass, normal tongue mobility.  Hard and soft palate normal with normal mobility. One plus tonsils, no erythema or exudate. Base of tongue, retromolar trigone and oral pharynx normal. Normal sensation, mobility and gag.   Neck: No cervical lymphadenopathy, mass or swelling. Salivary glands normal to palpation without swelling, erythema or mass. Normal facial nerve function. Normal thyroid  gland palpation.   Neurological: Alert and oriented to self, place and time.  Normal reflexes and motor skills, balance and coordination.   Psychiatric: No unusual anxiety or evidence of depression. Appropriate affect.     Independent Review of Additional Tests or Records:  Audiological evaluation: Audiometry: Right ear shows normal thresholds from 250 Hz through 3000 Hz then downsloping to mild loss from 4000 Hz through 8000 Hz.  The left ear shows normal thresholds from 250 Hz through 500 Hz then mild loss from 1000 Hz through 1500 Hz then moderate to moderately severe loss from 2000 Hz through 4000 Hz rising back to moderate loss at 6000 and 8000 Hz. SRT's: The right ear shows 15 dB HL.  The left ear shows 40 dB HL with 40 dB EM Word recognition scores: Right ear shows 96% at 60 dB HL.  The left ear shows 80% at 80 dB HL with 60 dB EM Procedures:  None   Impression & Plans:   1) asymmetrical sensorineural hearing loss   Hearing protection discussed Additional workup with high-resolution CT scan of temporal bones discussed (unable to have MRI scanning due to implanted spinal cord stimulator).  He will contact us  if he  wishes to pursue that option. Continue with amplification for the left ear Return to clinic 1 year to first  Alm RAMAN. [place]hour, PA-C [facility]        [1] Past Medical History: Diagnosis Date  . S/P insertion of spinal cord stimulator 09/14/2008  [2] Past Surgical History: Procedure Laterality Date  . REPLACEMENT TOTAL KNEE Left 03/10/2024  .  REVISION TOTAL HIP ARTHROPLASTY Left 11/10/2023  [3]  Current Outpatient Medications:  .  azelastine (ASTELIN) 137 mcg (0.1 %) nasal spray, Administer 2 sprays into each nostril 2 (two) times a day., Disp: , Rfl:  .  DOCOSAHEXAENOIC ACID ORAL, Take 1 g by mouth daily., Disp: , Rfl:  .  fluocinolone acetonide oiL 0.01 % drop, 4 drops to affected ear(s) once or twice daily as needed for itching, Disp: 20 mL, Rfl: 3 .  fluticasone propionate (FLONASE) 50 mcg/spray nasal spray, 2 sprays in each nostril daily, Disp: 16 g, Rfl: 11 .  guaiFENesin (MUCINEX) 600 mg 12 hr tablet, Take 600 mg by mouth., Disp: , Rfl:  .  levocetirizine (XYZAL) 5 mg tablet, Take 5 mg by mouth every evening., Disp: , Rfl:  .  montelukast (SINGULAIR) 10 mg tablet, Take 1 tablet by mouth daily., Disp: , Rfl:  .  multivitamin cap, Take 1 capsule by mouth Once Daily., Disp: , Rfl:  .  oxyCODONE -acetaminophen  (PERCOCET) 7.5-325 mg per tablet, 1 tablet., Disp: , Rfl:  .  rosuvastatin (CRESTOR) 5 mg tablet, Take 1 tablet by mouth daily., Disp: , Rfl:  .  traZODone  (DESYREL ) 50 mg tablet, TAKE 2 & 1/2 (TWO & ONE-HALF) TABLETS BY MOUTH AT BEDTIME AS NEEDED ONCE A DAY, Disp: , Rfl:

## 2024-05-18 NOTE — Progress Notes (Signed)
 [facility] - [facility] Note   Patient Name: Brett Hernandez   Patient DOB: [birth year]                Patient Age: 68 y.o.     Reason for Visit: [name] dropped his left TH Standard 6 RIC off at the office with the concern that it's not working.   Their managing audiologist is Laymon Quivers, Au.D., CCC-A.   Procedure: Replaced 3.0 wax filter and 8 mm tulip dome. Problem resolved and aid given back to [name].  Follow-up: PRN   Billing: No charge per [employer] one year warranty.

## 2024-06-07 DIAGNOSIS — H18513 Endothelial corneal dystrophy, bilateral: Secondary | ICD-10-CM | POA: Diagnosis not present

## 2024-06-11 ENCOUNTER — Encounter: Payer: Self-pay | Admitting: Internal Medicine

## 2024-06-11 ENCOUNTER — Other Ambulatory Visit: Payer: Self-pay

## 2024-06-11 ENCOUNTER — Ambulatory Visit (INDEPENDENT_AMBULATORY_CARE_PROVIDER_SITE_OTHER): Payer: Self-pay | Admitting: Internal Medicine

## 2024-06-11 VITALS — BP 122/80 | HR 69 | Temp 98.3°F | Ht 69.0 in | Wt 182.1 lb

## 2024-06-11 DIAGNOSIS — J3089 Other allergic rhinitis: Secondary | ICD-10-CM | POA: Diagnosis not present

## 2024-06-11 DIAGNOSIS — J343 Hypertrophy of nasal turbinates: Secondary | ICD-10-CM

## 2024-06-11 MED ORDER — FLUTICASONE PROPIONATE 50 MCG/ACT NA SUSP: 2.0000 | Freq: Every day | NASAL | 5 refills | Status: AC

## 2024-06-11 MED ORDER — AZELASTINE HCL 137 MCG/SPRAY NA SOLN: 2.0000 | Freq: Two times a day (BID) | NASAL | 5 refills | Status: AC

## 2024-06-11 MED ORDER — LEVOCETIRIZINE DIHYDROCHLORIDE 5 MG PO TABS: 5.0000 mg | ORAL_TABLET | Freq: Every day | ORAL | 5 refills | Status: AC

## 2024-06-11 MED ORDER — MONTELUKAST SODIUM 10 MG PO TABS: 10.0000 mg | ORAL_TABLET | Freq: Every day | ORAL | 5 refills | Status: AC

## 2024-06-11 NOTE — Progress Notes (Signed)
 NEW PATIENT  Date of Service/Encounter:  06/11/24  Consult requested by: Corlis Pagan, NP   Subjective:   Brett Hernandez (DOB: 06/01/1956) is a 68 y.o. male who presents to the clinic on 06/11/2024 with a chief complaint of Establish Care (C/o post nasal drip swollen glands sore throat) and Nasal Congestion .    History obtained from: chart review and patient.   Rhinitis:  Started many years ago but worse since 2015 when he moved from [place] to [place].   Worsened since July 2025.  Symptoms include: nasal congestion, rhinorrhea, and post nasal drainage  Did a course of antibiotics and prednisone due to lymphadenopathy  Occurs seasonally-Summer  Potential triggers: not sure   Treatments tried:  Singulair  and Xyzal  helping  Flonase  daily Azelastine  daily   Previous allergy testing: no History of sinus surgery: no T&A in the past in childhood  Nonallergic triggers: cold weather     Reviewed:  05/20/2024: seen by [facility]; Trouble with post nasal drip, sore throat, hx of lymphadenopathy.  On allergy meds, nasal sprays and tried abx + steroids.    04/20/2024: seen by [facility] for hearing loss, plan for hearing test.  06/10/2024: Seen by audiology for hearing aid care due to L sided SNHL and tinnitus.   Past Medical History: Past Medical History:  Diagnosis Date   Arthritis    Bilateral hip pain    Chronic pain    cervical, lumbar   Headache    Low back pain    Scoliosis    Spinal stenosis     Past Surgical History: Past Surgical History:  Procedure Laterality Date   ABDOMINAL EXPOSURE N/A 11/27/2022   Procedure: ABDOMINAL EXPOSURE;  Surgeon: Gretta Lonni PARAS, MD;  Location: [facility] OR;  Service: Vascular;  Laterality: N/A;   ADENOIDECTOMY     ANTERIOR CERVICAL DECOMP/DISCECTOMY FUSION N/A 12/06/2021   Procedure: ANTERIOR CERVICAL DECOMPRESSION AND FUSION CERVICAL FIVE- CERVICAL SIX WITH INSTRUMENTATION AND ALLOGRAFT;  Surgeon: Beuford Oneil, MD;  Location: [facility] OR;   Service: Orthopedics;  Laterality: N/A;   ANTERIOR CRUCIATE LIGAMENT REPAIR     ANTERIOR LAT LUMBAR FUSION Left 11/27/2022   Procedure: LEFT-SIDED LUMBAR 1- LUMBAR 2, LUMBAR 2- LUMBAR 3, LUMBAR 3- LUMBAR 4 LATERAL INTERBODY FUSION WITH INSTRUMENTATION AND ALLOGRAFT.  LUMBAR 4- LUMBAR 5, LUMBAR 5- SACRUM 1 ANTERIOR LUMBAR INTERBODY FUSION WITH INSTRUMENTATION AND ALLOGRAFT;  Surgeon: Beuford Oneil, MD;  Location: [facility] OR;  Service: Orthopedics;  Laterality: Left;   CARPAL TUNNEL RELEASE Bilateral    INGUINAL HERNIA REPAIR Bilateral    POSTERIOR LUMBAR FUSION 4 LEVEL N/A 11/28/2022   Procedure: LUMBAR 1- LUMBAR 2, LUMBAR 2- LUMBAR 3, LUMBAR 3- LUMBAR 4, LUMBAR 4- LUMBAR 5, LUMBAR 5- SACRUM 1 POSTERIOR DECOMPRESSION AND FUSION WITH INSTRUMENTATION AND ALLOGRAFT;  Surgeon: Beuford Oneil, MD;  Location: [facility] OR;  Service: Orthopedics;  Laterality: N/A;   ROTATOR CUFF REPAIR     SPINAL CORD STIMULATOR IMPLANT  2009   in neck   TONSILLECTOMY     removed as a child   TOTAL HIP ARTHROPLASTY Left 11/10/2023   Procedure: TOTAL HIP ARTHROPLASTY ANTERIOR APPROACH;  Surgeon: Yvone Rush, MD;  Location: [facility] ORS;  Service: Orthopedics;  Laterality: Left;   VASECTOMY      Family History: History reviewed. No pertinent family history.  Social History:  Flooring in bedroom: laminate Pets: dog Tobacco use/exposure: none Job: none  Medication List:  Allergies as of 06/11/2024  Reactions   Grass Extracts [gramineae Pollens]    HAY FEVER        Medication List        Accurate as of June 11, 2024 11:20 AM. If you have any questions, ask your nurse or doctor.          aspirin  EC 81 MG tablet Take 1 tablet (81 mg total) by mouth 2 (two) times daily with a meal. Take x 1 month post op to decrease risk of blood clots.   Azelastine  HCl 137 MCG/SPRAY Soln Place 2 sprays into both nostrils 2 (two) times daily.   celecoxib  200 MG capsule Commonly known as: CeleBREX  Take 1 capsule (200  mg total) by mouth 2 (two) times daily.   docusate sodium  100 MG capsule Commonly known as: Colace Take 1 capsule (100 mg total) by mouth 2 (two) times daily.   Fluocinolone Acetonide 0.01 % Oil Place 4 drops into both ears daily as needed (itching).   fluticasone  50 MCG/ACT nasal spray Commonly known as: FLONASE  Place 1 spray into both nostrils at bedtime as needed for allergies.   gabapentin  300 MG capsule Commonly known as: NEURONTIN  Take 300 mg by mouth 3 (three) times daily.   guaifenesin 400 MG Tabs tablet Commonly known as: HUMIBID E Take 400 mg by mouth every 4 (four) hours as needed (congestion).   HYDROmorphone  2 MG tablet Commonly known as: Dilaudid  Take 1-2 tablets (2-4 mg total) by mouth every 4 (four) hours as needed for severe pain (pain score 7-10).   levocetirizine 5 MG tablet Commonly known as: XYZAL  Take 5 mg by mouth daily.   montelukast  10 MG tablet Commonly known as: SINGULAIR  Take 10 mg by mouth daily.   oxyCODONE -acetaminophen  7.5-325 MG tablet Commonly known as: PERCOCET Take 1 tablet by mouth every 6 (six) hours as needed.   oxymetazoline 0.05 % nasal spray Commonly known as: AFRIN Place 1 spray into both nostrils at bedtime.   prednisoLONE acetate 1 % ophthalmic suspension Commonly known as: PRED FORTE Place 1 drop into the right eye 3 (three) times daily.   rosuvastatin 5 MG tablet Commonly known as: CRESTOR Take 5 mg by mouth daily.   tiZANidine  2 MG tablet Commonly known as: ZANAFLEX  Take 1 tablet (2 mg total) by mouth every 8 (eight) hours as needed for muscle spasms.   traZODone  50 MG tablet Commonly known as: DESYREL  Take 125 mg by mouth at bedtime.         REVIEW OF SYSTEMS: Pertinent positives and negatives discussed in HPI.   Objective:   Physical Exam: BP 122/80   Pulse 69   Temp 98.3 F (36.8 C)   Ht 5' 9 (1.753 m)   Wt 182 lb 1.6 oz (82.6 kg)   SpO2 97%   BMI 26.89 kg/m  Body mass index is 26.89  kg/m. GEN: alert, well developed HEENT: clear conjunctiva, nose with + mild inferior turbinate hypertrophy, pink nasal mucosa, no rhinorrhea, + cobblestoning HEART: regular rate and rhythm, no murmur LUNGS: clear to auscultation bilaterally, no coughing, unlabored respiration ABDOMEN: soft, non distended  SKIN: no rashes or lesions  Assessment:   1. Other allergic rhinitis   2. Nasal turbinate hypertrophy     Plan/Recommendations:  Other Allergic Rhinitis: - Due to turbinate hypertrophy, seasonal symptoms and unresponsive to over the counter meds, will perform skin testing to identify aeroallergen triggers.   - Also discussed allergic rhinitis would not cause lymphadenopathy, likely had a viral URI at the  time. If recurrent lymphadenopathy, please follow up with PCP for further workup.  - Use nasal saline rinses before nose sprays such as with Neilmed Sinus Rinse.  Use distilled water .   - Use Flonase  2 sprays each nostril daily. Aim upward and outward. - Use Azelastine  2 sprays each nostril twice daily as needed for runny nose, drainage, sneezing, congestion. Aim upward and outward. - Use Xyzal  5mg  daily.  - Use Singulair  10mg  daily.  Stop if there are any mood/behavioral changes.   Hold all anti-histamines (Azelastine , Levocetirizine/Xyzal , Allegra, Zyrtec, Claritin, Benadryl, Pepcid) 3 days prior to next visit.   Follow up: 930 on 10/7 for skin testing 1-55, IDs okay    Arleta Blanch, MD [facility] 

## 2024-06-11 NOTE — Patient Instructions (Addendum)
 Other Allergic Rhinitis: - Use nasal saline rinses before nose sprays such as with Neilmed Sinus Rinse.  Use distilled water .   - Use Flonase  2sprays each nostril daily. Aim upward and outward. - Use Azelastine  2 sprays each nostril twice daily as needed for runny nose, drainage, sneezing, congestion. Aim upward and outward. - Use Xyzal  5mg  daily.  - Use Singulair  10mg  daily.  Stop if there are any mood/behavioral changes.   Hold all anti-histamines (Azelastine , Levocetirizine/Xyzal , Allegra, Zyrtec, Claritin, Benadryl, Pepcid) 3 days prior to next visit.  Okay to continue Mucinex, Flonase , Singulair /Montelukast    Follow up: 930 on 10/7 for skin testing 1-55, IDs okay

## 2024-06-15 DIAGNOSIS — M160 Bilateral primary osteoarthritis of hip: Secondary | ICD-10-CM | POA: Diagnosis not present

## 2024-06-15 DIAGNOSIS — M546 Pain in thoracic spine: Secondary | ICD-10-CM | POA: Diagnosis not present

## 2024-06-15 DIAGNOSIS — G894 Chronic pain syndrome: Secondary | ICD-10-CM | POA: Diagnosis not present

## 2024-06-15 DIAGNOSIS — M2550 Pain in unspecified joint: Secondary | ICD-10-CM | POA: Diagnosis not present

## 2024-06-15 DIAGNOSIS — M47816 Spondylosis without myelopathy or radiculopathy, lumbar region: Secondary | ICD-10-CM | POA: Diagnosis not present

## 2024-06-15 DIAGNOSIS — M170 Bilateral primary osteoarthritis of knee: Secondary | ICD-10-CM | POA: Diagnosis not present

## 2024-06-16 DIAGNOSIS — M546 Pain in thoracic spine: Secondary | ICD-10-CM | POA: Diagnosis not present

## 2024-06-22 ENCOUNTER — Ambulatory Visit: Admitting: Internal Medicine

## 2024-06-29 DIAGNOSIS — M25562 Pain in left knee: Secondary | ICD-10-CM | POA: Diagnosis not present

## 2024-06-29 DIAGNOSIS — Z96652 Presence of left artificial knee joint: Secondary | ICD-10-CM | POA: Diagnosis not present

## 2024-06-30 DIAGNOSIS — M546 Pain in thoracic spine: Secondary | ICD-10-CM | POA: Diagnosis not present

## 2024-06-30 DIAGNOSIS — M47894 Other spondylosis, thoracic region: Secondary | ICD-10-CM | POA: Diagnosis not present

## 2024-07-02 ENCOUNTER — Ambulatory Visit (INDEPENDENT_AMBULATORY_CARE_PROVIDER_SITE_OTHER): Admitting: Internal Medicine

## 2024-07-02 DIAGNOSIS — J3089 Other allergic rhinitis: Secondary | ICD-10-CM | POA: Diagnosis not present

## 2024-07-02 NOTE — Progress Notes (Signed)
 FOLLOW UP Date of Service/Encounter:  07/02/24   Subjective:  Brett Hernandez (DOB: Aug 22, 1956) is a 68 y.o. male who [facility] for follow up for skin testing.   History obtained from: chart review and patient.  Anti histamines held.   Past Medical History: Past Medical History:  Diagnosis Date   Arthritis    Bilateral hip pain    Chronic pain    cervical, lumbar   Headache    Low back pain    Scoliosis    Spinal stenosis     Objective:  There were no vitals taken for this visit. There is no height or weight on file to calculate BMI. Physical Exam: GEN: alert, well developed HEENT: clear conjunctiva, MMM LUNGS: unlabored respiration  Skin Testing:  Skin prick testing was placed, which includes aeroallergens/foods, histamine control, and saline control.  Verbal consent was obtained prior to placing test.  Patient tolerated procedure well.  Allergy testing results were read and interpreted by myself, documented by clinical staff. Adequate positive and negative control.  Positive results to:  Results discussed with patient/family.  Airborne Adult Perc - 07/02/24 0914     Time Antigen Placed 0914    Allergen Manufacturer Jestine    Location Back    Number of Test 55    1. Control-Buffer 50% Glycerol Negative    2. Control-Histamine 3+    3. Bahia Negative    4. French Southern Territories Negative    5. Clermont Negative    6. [place]  Blue Negative    7. Meadow Fescue Negative    8. Perennial Rye Negative    9. Timothy Negative    10. Ragweed Mix Negative    11. Cocklebur Negative    12. Plantain,  English Negative    13. Baccharis Negative    14. Dog Fennel Negative    15. Russian Thistle Negative    16. Lamb's Quarters Negative    17. Sheep Sorrell Negative    18. Rough Pigweed Negative    19. Marsh Elder, Rough Negative    20. Mugwort, Common Negative    21. Box, Elder Negative    22. Cedar, red Negative    23. Sweet Gum  Negative    24. Pecan Pollen Negative    25. Pine Mix Negative    26. Walnut, Black Pollen Negative    27. Red Mulberry Negative    28. Ash Mix Negative    29. Birch Mix Negative    30. Beech American Negative    31. Cottonwood, Guinea-Bissau Negative    32. Hickory, White Negative    33. Maple Mix Negative    34. Oak, Guinea-[place]issau Mix Negative    35. Sycamore Eastern Negative    36. Alternaria Alternata Negative    37. Cladosporium Herbarum Negative    38. Aspergillus Mix Negative    39. Penicillium Mix Negative    40. Bipolaris Sorokiniana (Helminthosporium) Negative    41. Drechslera Spicifera (Curvularia) Negative    42. Mucor Plumbeus Negative    43. Fusarium Moniliforme Negative    44. Aureobasidium Pullulans (pullulara) Negative    45. Rhizopus Oryzae Negative    46. Botrytis Cinera Negative    47. Epicoccum Nigrum Negative    48. Phoma Betae Negative    49. Dust Mite Mix Negative    50. Cat Hair 10,000 BAU/ml Negative    51.  Dog Epithelia Negative    52. Mixed Feathers Negative  53. Horse Epithelia Negative    54. Cockroach, German Negative    55. Tobacco Leaf Negative          Intradermal - 07/02/24 0956     Time Antigen Placed 9043    Allergen Manufacturer Jestine    Location Arm    Number of Test 16    Control Negative    Bahia Negative    French Southern Territories Negative    Thurman Negative    7 Grass Negative    Ragweed Mix Negative    Weed Mix Negative    Tree Mix Negative    Mold 1 Negative    Mold 2 Negative    Mold 3 Negative    Mold 4 Negative    Mite Mix 3+    Cat Negative    Dog Negative    Cockroach Negative           Assessment:   1. Allergic rhinitis due to dust mite     Plan/Recommendations:  Allergic Rhinitis: - Due to turbinate hypertrophy, seasonal symptoms and unresponsive to over the counter meds, will perform skin testing to identify aeroallergen triggers.   - SPT 06/2024: positive to dust mites - Avoidance measures discussed.   - Use  nasal saline rinses before nose sprays such as with Neilmed Sinus Rinse.  Use distilled water .   - Use Flonase  2 sprays each nostril daily. Aim upward and outward. - Use Azelastine  2 sprays each nostril twice daily. Aim upward and outward. - Use Xyzal  5mg  daily.  - Use Singulair  10mg  daily.  Stop if there are any mood/behavioral changes. - Consider allergy shots as long term control of your symptoms by teaching your immune system to be more tolerant of your allergy triggers  ALLERGEN AVOIDANCE MEASURES   Dust Mites Use central air conditioning and heat; and change the filter monthly.  Pleated filters work better than mesh filters.  Electrostatic filters may also be used; wash the filter monthly.  Window air conditioners may be used, but do not clean the air as well as a central air conditioner.  Change or wash the filter monthly. Keep windows closed.  Do not use attic fans.   Encase the mattress, box springs and pillows with zippered, dust proof covers. Wash the bed linens in hot water  weekly.   Remove carpet, especially from the bedroom. Remove stuffed animals, throw pillows, dust ruffles, heavy drapes and other items that collect dust from the bedroom. Do not use a humidifier.   Use wood, vinyl or leather furniture instead of cloth furniture in the bedroom. Keep the indoor humidity at 30 - 40%.  M    Return in about 3 months (around 10/02/2024).  Arleta Blanch, MD [facility] 

## 2024-07-02 NOTE — Patient Instructions (Addendum)
 Mixed Rhinitis: - SPT 06/2024: positive to dust mites - Use nasal saline rinses before nose sprays such as with Neilmed Sinus Rinse.  Use distilled water .   - Use Flonase  2 sprays each nostril daily. Aim upward and outward. - Use Azelastine  2 sprays each nostril twice daily. Aim upward and outward. - Use Xyzal  5mg  daily.  - Use Singulair  10mg  daily.  Stop if there are any mood/behavioral changes. - Consider allergy shots as long term control of your symptoms by teaching your immune system to be more tolerant of your allergy triggers  ALLERGEN AVOIDANCE MEASURES   Dust Mites Use central air conditioning and heat; and change the filter monthly.  Pleated filters work better than mesh filters.  Electrostatic filters may also be used; wash the filter monthly.  Window air conditioners may be used, but do not clean the air as well as a central air conditioner.  Change or wash the filter monthly. Keep windows closed.  Do not use attic fans.   Encase the mattress, box springs and pillows with zippered, dust proof covers. Wash the bed linens in hot water  weekly.   Remove carpet, especially from the bedroom. Remove stuffed animals, throw pillows, dust ruffles, heavy drapes and other items that collect dust from the bedroom. Do not use a humidifier.   Use wood, vinyl or leather furniture instead of cloth furniture in the bedroom. Keep the indoor humidity at 30 - 40%.  M

## 2024-07-29 DIAGNOSIS — Z96642 Presence of left artificial hip joint: Secondary | ICD-10-CM | POA: Diagnosis not present

## 2024-07-29 DIAGNOSIS — M7062 Trochanteric bursitis, left hip: Secondary | ICD-10-CM | POA: Diagnosis not present

## 2024-07-29 DIAGNOSIS — M1611 Unilateral primary osteoarthritis, right hip: Secondary | ICD-10-CM | POA: Diagnosis not present

## 2024-08-19 DIAGNOSIS — M1611 Unilateral primary osteoarthritis, right hip: Secondary | ICD-10-CM | POA: Diagnosis not present

## 2024-09-28 ENCOUNTER — Other Ambulatory Visit: Payer: Self-pay | Admitting: Orthopedic Surgery

## 2024-10-07 NOTE — Patient Instructions (Signed)
 SURGICAL WAITING ROOM VISITATION  Patients having surgery or a procedure may have no more than 2 support people in the waiting area - these visitors may rotate.    Children ages 15 and under will not be able to visit patients in [facility] under most circumstances.   Visitors with respiratory illnesses are discouraged from visiting and should remain at home.  If the patient needs to stay at the hospital during part of their recovery, the visitor guidelines for inpatient rooms apply. Pre-op nurse will coordinate an appropriate time for 1 support person to accompany patient in pre-op.  This support person may not rotate.    Please refer to the [facility] website for the visitor guidelines for Inpatients (after your surgery is over and you are in a regular room).       Your procedure is scheduled on: 10/18/2024    Report to [facility] Main Entrance    Report to admitting at   [phone]   Call this number if you have problems the morning of surgery [phone]   Do not eat food :After Midnight.   After Midnight you may have the following liquids until __ 0415____ AM DAY OF SURGERY  Water  Non-Citrus Juices (without pulp, NO RED-Apple, White grape, White cranberry) Black Coffee (NO MILK/CREAM OR CREAMERS, sugar ok)  Clear Tea (NO MILK/CREAM OR CREAMERS, sugar ok) regular and decaf                             Plain Jell-O (NO RED)                                           Fruit ices (not with fruit pulp, NO RED)                                     Popsicles (NO RED)                                                               Sports drinks like Gatorade (NO RED)                   The day of surgery:  Drink ONE (1) Pre-Surgery Clear Ensure or G2 at  0415AM the morning of surgery. Drink in one sitting. Do not sip.  This drink was given to you during your hospital  pre-op appointment visit. Nothing else to drink after completing the  Pre-Surgery Clear Ensure or  G2.          If you have questions, please contact your surgeons office.       Oral Hygiene is also important to reduce your risk of infection.                                    Remember - BRUSH YOUR TEETH THE MORNING OF SURGERY WITH YOUR REGULAR TOOTHPASTE  DENTURES WILL BE REMOVED PRIOR TO SURGERY PLEASE DO NOT APPLY Poly grip OR  ADHESIVES!!!   Do NOT smoke after Midnight   Stop all vitamins and herbal supplements 7 days before surgery.   Take these medicines the morning of surgery with A SIP OF WATER :  nasal spray, xyzal , singulair , crestor   DO NOT TAKE ANY ORAL DIABETIC MEDICATIONS DAY OF YOUR SURGERY  Bring CPAP mask and tubing day of surgery.                              You may not have any metal on your body including hair pins, jewelry, and body piercing             Do not wear make-up, lotions, powders, perfumes/cologne, or deodorant  Do not wear nail polish including gel and S&S, artificial/acrylic nails, or any other type of covering on natural nails including finger and toenails. If you have artificial nails, gel coating, etc. that needs to be removed by a nail salon please have this removed prior to surgery or surgery may need to be canceled/ delayed if the surgeon/ anesthesia feels like they are unable to be safely monitored.   Do not shave  48 hours prior to surgery.               Men may shave face and neck.   Do not bring valuables to the hospital. [facility] IS NOT             RESPONSIBLE   FOR VALUABLES.   Contacts, glasses, dentures or bridgework may not be worn into surgery.   Bring small overnight bag day of surgery.   DO NOT BRING YOUR HOME MEDICATIONS TO THE HOSPITAL. PHARMACY WILL DISPENSE MEDICATIONS LISTED ON YOUR MEDICATION LIST TO YOU DURING YOUR ADMISSION IN THE HOSPITAL!    Patients discharged on the day of surgery will not be allowed to drive home.  Someone NEEDS to stay with you for the first 24 hours after anesthesia.   Special  Instructions: Bring a copy of your healthcare power of attorney and living will documents the day of surgery if you haven't scanned them before.              Please read over the following fact sheets you were given: IF YOU HAVE QUESTIONS ABOUT YOUR PRE-OP INSTRUCTIONS PLEASE CALL [phone].   If you received a COVID test during your pre-op visit  it is requested that you wear a mask when out in public, stay away from anyone that may not be feeling well and notify your surgeon if you develop symptoms. If you test positive for Covid or have been in contact with anyone that has tested positive in the last 10 days please notify you surgeon.      Pre-operative 4 CHG Bath Instructions   You can play a key role in reducing the risk of infection after surgery. Your skin needs to be as free of germs as possible. You can reduce the number of germs on your skin by washing with CHG (chlorhexidine  gluconate) soap before surgery. CHG is an antiseptic soap that kills germs and continues to kill germs even after washing.   DO NOT use if you have an allergy  to chlorhexidine /CHG or antibacterial soaps. If your skin becomes reddened or irritated, stop using the CHG and notify one of our RNs at [phone].   Please shower with the CHG soap starting 4 days before surgery using the following schedule:     Please keep  in mind the following:  DO NOT shave, including legs and underarms, starting the day of your first shower.   You may shave your face at any point before/day of surgery.  Place clean sheets on your bed the day you start using CHG soap. Use a clean washcloth (not used since being washed) for each shower. DO NOT sleep with pets once you start using the CHG.   CHG Shower Instructions:  If you choose to wash your hair and private area, wash first with your normal shampoo/soap.  After you use shampoo/soap, rinse your hair and body thoroughly to remove shampoo/soap residue.  Turn the water  OFF and  apply about 3 tablespoons (45 ml) of CHG soap to a CLEAN washcloth.  Apply CHG soap ONLY FROM YOUR NECK DOWN TO YOUR TOES (washing for 3-5 minutes)  DO NOT use CHG soap on face, private areas, open wounds, or sores.  Pay special attention to the area where your surgery is being performed.  If you are having back surgery, having someone wash your back for you may be helpful. Wait 2 minutes after CHG soap is applied, then you may rinse off the CHG soap.  Pat dry with a clean towel  Put on clean clothes/pajamas   If you choose to wear lotion, please use ONLY the CHG-compatible lotions on the back of this paper.     Additional instructions for the day of surgery: DO NOT APPLY any lotions, deodorants, cologne, or perfumes.   Put on clean/comfortable clothes.  Brush your teeth.  Ask your nurse before applying any prescription medications to the skin.      CHG Compatible Lotions   Aveeno Moisturizing lotion  Cetaphil Moisturizing Cream  Cetaphil Moisturizing Lotion  Clairol Herbal Essence Moisturizing Lotion, Dry Skin  Clairol Herbal Essence Moisturizing Lotion, Extra Dry Skin  Clairol Herbal Essence Moisturizing Lotion, Normal Skin  Curel Age Defying Therapeutic Moisturizing Lotion with Alpha Hydroxy  Curel Extreme Care Body Lotion  Curel Soothing Hands Moisturizing Hand Lotion  Curel Therapeutic Moisturizing Cream, Fragrance-Free  Curel Therapeutic Moisturizing Lotion, Fragrance-Free  Curel Therapeutic Moisturizing Lotion, Original Formula  Eucerin Daily Replenishing Lotion  Eucerin Dry Skin Therapy Plus Alpha Hydroxy Crme  Eucerin Dry Skin Therapy Plus Alpha Hydroxy Lotion  Eucerin Original Crme  Eucerin Original Lotion  Eucerin Plus Crme Eucerin Plus Lotion  Eucerin TriLipid Replenishing Lotion  Keri Anti-Bacterial Hand Lotion  Keri Deep Conditioning Original Lotion Dry Skin Formula Softly Scented  Keri Deep Conditioning Original Lotion, Fragrance Free Sensitive Skin  Formula  Keri Lotion Fast Absorbing Fragrance Free Sensitive Skin Formula  Keri Lotion Fast Absorbing Softly Scented Dry Skin Formula  Keri Original Lotion  Keri Skin Renewal Lotion Keri Silky Smooth Lotion  Keri Silky Smooth Sensitive Skin Lotion  Nivea Body Creamy Conditioning Oil  Nivea Body Extra Enriched Teacher, Adult Education Moisturizing Lotion Nivea Crme  Nivea Skin Firming Lotion  NutraDerm 30 Skin Lotion  NutraDerm Skin Lotion  NutraDerm Therapeutic Skin Cream  NutraDerm Therapeutic Skin Lotion  ProShield Protective Hand Cream  Provon moisturizing lotion

## 2024-10-07 NOTE — Progress Notes (Addendum)
 Anesthesia Review:  PCP: Izetta Cork  Cardiologist : none   PPM/ ICD: Device Orders: Rep Notified:  Chest x-ray : 10/14/24- 2 view  EKG : 10/14/24  Echo : Stress test: Cardiac Cath :   Activity level: can do a flgiht of stairs without difficulty  Sleep Study/ CPAP : none  Fasting Blood Sugar :      / Checks Blood Sugar -- times a day:    Blood Thinner/ Instructions /Last Dose: ASA / Instructions/ Last Dose :    10/12/24 0900am-  LVMM to complete appt and asked for call back. 0920- LVMM and asked for call back. 10/12/24 -935 Med hx and preop instructons compelted via phone call.  PT states he is unable to come on 10/14/2024 for albs due to appt with DR Graves at same time.  Will change lab appt to 0930 on 10/14/24.  PT also staes his surgery time has changed and he will be unable to come on 10/18/24 at 0515 for srugery. PT was asked to call surgery schedyuler and pt voiced understanding.    Pt has Spinal Cord Stimulator which is not is use per pt and is turned off.   Hearing Aid in left Ear

## 2024-10-12 ENCOUNTER — Encounter (HOSPITAL_COMMUNITY)
Admission: RE | Admit: 2024-10-12 | Discharge: 2024-10-12 | Disposition: A | Discharge status: Home / Self Care | Source: Ambulatory Visit | Attending: Orthopedic Surgery | Admitting: Orthopedic Surgery

## 2024-10-12 ENCOUNTER — Encounter (HOSPITAL_COMMUNITY): Payer: Self-pay

## 2024-10-12 ENCOUNTER — Other Ambulatory Visit: Payer: Self-pay

## 2024-10-12 VITALS — Ht 69.0 in | Wt 184.0 lb

## 2024-10-12 DIAGNOSIS — Z01818 Encounter for other preprocedural examination: Secondary | ICD-10-CM | POA: Diagnosis present

## 2024-10-14 ENCOUNTER — Encounter (HOSPITAL_COMMUNITY)
Admission: RE | Admit: 2024-10-14 | Discharge: 2024-10-14 | Disposition: A | Discharge status: Home / Self Care | Source: Ambulatory Visit | Attending: Orthopedic Surgery | Admitting: Orthopedic Surgery

## 2024-10-14 ENCOUNTER — Ambulatory Visit (HOSPITAL_COMMUNITY)
Admission: RE | Admit: 2024-10-14 | Discharge: 2024-10-14 | Disposition: A | Discharge status: Home / Self Care | Source: Ambulatory Visit | Attending: Orthopedic Surgery | Admitting: Orthopedic Surgery

## 2024-10-14 DIAGNOSIS — Z01818 Encounter for other preprocedural examination: Secondary | ICD-10-CM | POA: Diagnosis not present

## 2024-10-14 LAB — BASIC METABOLIC PANEL WITH GFR
Anion gap: 9 (ref 5–15)
BUN: 13 mg/dL (ref 8–23)
CO2: 27 mmol/L (ref 22–32)
Calcium: 9.8 mg/dL (ref 8.9–10.3)
Chloride: 101 mmol/L (ref 98–111)
Creatinine, Ser: 1.09 mg/dL (ref 0.61–1.24)
GFR, Estimated: >60 mL/min
Glucose, Bld: 86 mg/dL (ref 70–99)
Potassium: 4.7 mmol/L (ref 3.5–5.1)
Sodium: 137 mmol/L (ref 135–145)

## 2024-10-14 LAB — CBC
HCT: 45.9 % (ref 39.0–52.0)
Hemoglobin: 14.6 g/dL (ref 13.0–17.0)
MCH: 28.1 pg (ref 26.0–34.0)
MCHC: 31.8 g/dL (ref 30.0–36.0)
MCV: 88.4 fL (ref 80.0–100.0)
Platelets: 292 10*3/uL (ref 150–400)
RBC: 5.19 MIL/uL (ref 4.22–5.81)
RDW: 14 % (ref 11.5–15.5)
WBC: 7.4 10*3/uL (ref 4.0–10.5)
nRBC: 0 % (ref 0.0–0.2)

## 2024-10-14 LAB — GLUCOSE, CAPILLARY: Glucose-Capillary: 113 mg/dL — ABNORMAL HIGH (ref 70–99)

## 2024-10-14 LAB — SURGICAL PCR SCREEN
MRSA, PCR: NEGATIVE
Staphylococcus aureus: NEGATIVE

## 2024-10-14 NOTE — Care Plan (Signed)
 Ortho Bundle Case Management Note  Patient Details  Name: Brett Hernandez MRN: [id number] Date of Birth: Jan 30, 1956  met with patient in the office for H&P. will discharge to home with family to assist. has rollin walker. OPPT set up with [facility] discharge instructions discussed and questions answered. Patient and MD in agreement with plan. Choice offered                    DME Arranged:    DME Agency:     HH Arranged:    HH Agency:     Additional Comments: Please contact me with any questions of if this plan should need to change.  Charlies Pitch,  RN,BSN,MHA,CCM  [facility]  [phone] 10/14/2024, 2:14 PM

## 2024-10-15 DIAGNOSIS — M1611 Unilateral primary osteoarthritis, right hip: Secondary | ICD-10-CM | POA: Diagnosis present

## 2024-10-18 ENCOUNTER — Encounter (HOSPITAL_COMMUNITY): Payer: Self-pay | Admitting: Certified Registered"

## 2024-10-18 ENCOUNTER — Encounter (HOSPITAL_COMMUNITY): Admission: RE | Payer: Self-pay | Source: Home / Self Care

## 2024-10-18 ENCOUNTER — Ambulatory Visit (HOSPITAL_COMMUNITY): Admission: RE | Admit: 2024-10-18 | Source: Home / Self Care | Admitting: Orthopedic Surgery

## 2024-10-18 DIAGNOSIS — M1611 Unilateral primary osteoarthritis, right hip: Secondary | ICD-10-CM | POA: Diagnosis present

## 2024-10-18 LAB — TYPE AND SCREEN
ABO/RH(D): B POS
Antibody Screen: NEGATIVE
